# Patient Record
Sex: Female | Born: 1960 | Race: White | Hispanic: No | Marital: Single | Smoking: Never smoker
Health system: Southern US, Community
[De-identification: ages and names within clinical notes are randomized; demographics above are authoritative.]

## PROBLEM LIST (undated history)

## (undated) DIAGNOSIS — Z9889 Other specified postprocedural states: Secondary | ICD-10-CM

## (undated) DIAGNOSIS — I219 Acute myocardial infarction, unspecified: Secondary | ICD-10-CM

## (undated) DIAGNOSIS — I10 Essential (primary) hypertension: Secondary | ICD-10-CM

## (undated) DIAGNOSIS — E785 Hyperlipidemia, unspecified: Secondary | ICD-10-CM

## (undated) DIAGNOSIS — R0681 Apnea, not elsewhere classified: Secondary | ICD-10-CM

## (undated) DIAGNOSIS — K7460 Unspecified cirrhosis of liver: Secondary | ICD-10-CM

## (undated) DIAGNOSIS — R112 Nausea with vomiting, unspecified: Secondary | ICD-10-CM

## (undated) DIAGNOSIS — T4145XA Adverse effect of unspecified anesthetic, initial encounter: Secondary | ICD-10-CM

## (undated) DIAGNOSIS — T8859XA Other complications of anesthesia, initial encounter: Secondary | ICD-10-CM

## (undated) HISTORY — DX: Apnea, not elsewhere classified: R06.81

## (undated) HISTORY — DX: Acute myocardial infarction, unspecified: I21.9

## (undated) HISTORY — DX: Essential (primary) hypertension: I10

## (undated) HISTORY — PX: CARPAL TUNNEL RELEASE: SHX101

## (undated) HISTORY — PX: ROTATOR CUFF REPAIR: SHX139

## (undated) HISTORY — DX: Hyperlipidemia, unspecified: E78.5

## (undated) HISTORY — PX: CHOLECYSTECTOMY: SHX55

---
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
## 2014-11-08 ENCOUNTER — Telehealth: Payer: Self-pay | Admitting: Pediatrics

## 2014-11-08 NOTE — Telephone Encounter (Signed)
Was in the office on 10/25/14 for asthma and allergies. She has taken the Prednisone and Amoxacillin that was prescribed but is still not feeling any better. Can she have something different or is there something else that she needs to try. She uses [facility] which closes 6:30pm.

## 2014-11-09 NOTE — Telephone Encounter (Signed)
Was in the office on 10/25/14 for asthma and allergies.she has taken the prednisone and amoxicillin that was prescribed but is still not feeling any better. Can she have something different or is there something else that she needs to try? She uses [facility] which closes 6:30pm

## 2014-11-09 NOTE — Telephone Encounter (Signed)
Spoke with pt. She will call back next week if not feeling better.

## 2014-11-09 NOTE — Telephone Encounter (Signed)
Inform patient that antibiotics will need some time to work, please continue allergy and asthma medications.  Call us next week if not better.

## 2017-01-15 ENCOUNTER — Other Ambulatory Visit (HOSPITAL_COMMUNITY): Payer: Self-pay | Admitting: General Surgery

## 2017-01-28 ENCOUNTER — Ambulatory Visit (HOSPITAL_COMMUNITY): Payer: BLUE CROSS/BLUE SHIELD

## 2017-01-28 ENCOUNTER — Ambulatory Visit (HOSPITAL_COMMUNITY)
Admission: RE | Admit: 2017-01-28 | Discharge: 2017-01-28 | Disposition: A | Discharge status: Home / Self Care | Payer: BLUE CROSS/BLUE SHIELD | Source: Ambulatory Visit | Attending: General Surgery | Admitting: General Surgery

## 2017-01-28 DIAGNOSIS — I517 Cardiomegaly: Secondary | ICD-10-CM | POA: Insufficient documentation

## 2017-01-28 DIAGNOSIS — Z9049 Acquired absence of other specified parts of digestive tract: Secondary | ICD-10-CM | POA: Insufficient documentation

## 2017-01-28 DIAGNOSIS — K222 Esophageal obstruction: Secondary | ICD-10-CM | POA: Diagnosis not present

## 2017-02-15 ENCOUNTER — Other Ambulatory Visit: Payer: Self-pay | Admitting: General Surgery

## 2017-02-15 DIAGNOSIS — E049 Nontoxic goiter, unspecified: Secondary | ICD-10-CM

## 2017-02-25 ENCOUNTER — Ambulatory Visit
Admission: RE | Admit: 2017-02-25 | Discharge: 2017-02-25 | Disposition: A | Discharge status: Home / Self Care | Payer: BLUE CROSS/BLUE SHIELD | Source: Ambulatory Visit | Attending: General Surgery | Admitting: General Surgery

## 2017-02-25 DIAGNOSIS — E049 Nontoxic goiter, unspecified: Secondary | ICD-10-CM

## 2017-05-17 ENCOUNTER — Encounter: Payer: Self-pay | Admitting: Skilled Nursing Facility1

## 2017-05-17 ENCOUNTER — Encounter: Payer: BLUE CROSS/BLUE SHIELD | Attending: General Surgery | Admitting: Skilled Nursing Facility1

## 2017-05-17 DIAGNOSIS — Z6841 Body Mass Index (BMI) 40.0 and over, adult: Secondary | ICD-10-CM | POA: Insufficient documentation

## 2017-05-17 DIAGNOSIS — Z713 Dietary counseling and surveillance: Secondary | ICD-10-CM | POA: Insufficient documentation

## 2017-05-17 DIAGNOSIS — E669 Obesity, unspecified: Secondary | ICD-10-CM

## 2017-05-17 NOTE — Progress Notes (Signed)
Pre-Op Assessment Visit:  Pre-Operative Sleeve Gastrectomy Surgery  Medical Nutrition Therapy:  Appt start time: 8:01  End time:  9:00  Patient was seen on 05/17/2017  for Pre-Operative Nutrition Assessment. Assessment and letter of approval fax[facility] to [facility] coordinator on 05/17/2017.   Pt reports being very busy at work. Pt states she just had plantar fasciitis surgery so she has trouble walking. Pt states her son is autistic and is a picky eater. Pt states she does not cook because she would only be cooking for herself. Pt states her son will only eat [employer] nuggets. Pt states she is on the road a lot. Pt states she cannot get her son certain help because she does not want to take him out of school.   Pt expectation of surgery: to lose weight and get off some medications   Pt expectation of Dietitian: to help me get different meal plans that are more suitable for being on the road   Start weight at [facility]: 299.7 BMI: 54.82  24 hr Dietary Recall: First Meal: fast food biscuit---oatmeal Snack: Second Meal: 1 slice of pizza Snack: fruit Third Meal: 6 in meatball sub from [employer] Snack:  Beverages: soda, water, 2% milk  Encouraged to engage in 150 minutes of moderate physical activity including cardiovascular and weight baring weekly  Handouts given during visit include:  . Pre-Op Goals . Bariatric Surgery Protein Shakes During the appointment today the following Pre-Op Goals were reviewed with the patient: . Maintain or lose weight as instructed by your surgeon . Make healthy food choices . Begin to limit portion sizes . Limited concentrated sugars and fried foods . Keep fat/sugar in the single digits per serving on             food labels . Practice CHEWING your food  (aim for 30 chews per bite or until applesauce consistency) . Practice not drinking 15 minutes before, during, and 30 minutes after each meal/snack . Avoid all  carbonated beverages  . Avoid/limit caffeinated beverages  . Avoid all sugar-sweetened beverages . Consume 3 meals per day; eat every 3-5 hours . Make a list of non-food related activities . Aim for 64-100 ounces of FLUID daily  . Aim for at least 60-80 grams of PROTEIN daily . Look for a liquid protein source that contain ?15 g protein and ?5 g carbohydrate  (ex: shakes, drinks, shots)  -Follow diet recommendations listed below   Energy and Macronutrient Recomendations: Calories: 1500 Carbohydrate: 170 Protein: 112 Fat: 42  Demonstrated degree of understanding via:  Teach Back  Teaching Method Utilized:  Visual Auditory Hands on  Barriers to learning/adherence to lifestyle change: work/life balance and increased stress   Patient to call the [facility] to enroll in Pre-Op and Post-Op Nutrition Education when surgery date is scheduled.

## 2017-06-22 ENCOUNTER — Ambulatory Visit (INDEPENDENT_AMBULATORY_CARE_PROVIDER_SITE_OTHER): Payer: BLUE CROSS/BLUE SHIELD | Admitting: Psychiatry

## 2017-06-22 DIAGNOSIS — F509 Eating disorder, unspecified: Secondary | ICD-10-CM | POA: Diagnosis not present

## 2017-07-01 ENCOUNTER — Ambulatory Visit (INDEPENDENT_AMBULATORY_CARE_PROVIDER_SITE_OTHER): Payer: BLUE CROSS/BLUE SHIELD | Admitting: Psychiatry

## 2017-07-01 DIAGNOSIS — F509 Eating disorder, unspecified: Secondary | ICD-10-CM

## 2017-07-12 ENCOUNTER — Ambulatory Visit: Payer: Self-pay

## 2017-07-19 ENCOUNTER — Encounter: Payer: BLUE CROSS/BLUE SHIELD | Attending: General Surgery | Admitting: Skilled Nursing Facility1

## 2017-07-19 DIAGNOSIS — Z6841 Body Mass Index (BMI) 40.0 and over, adult: Secondary | ICD-10-CM | POA: Insufficient documentation

## 2017-07-19 DIAGNOSIS — Z713 Dietary counseling and surveillance: Secondary | ICD-10-CM | POA: Diagnosis present

## 2017-07-19 DIAGNOSIS — E669 Obesity, unspecified: Secondary | ICD-10-CM

## 2017-07-21 ENCOUNTER — Encounter: Payer: Self-pay | Admitting: Skilled Nursing Facility1

## 2017-07-21 NOTE — Progress Notes (Signed)
Pre-Operative Nutrition Class:  Appt start time: 1655   End time:  1830.  Patient was seen on 07/19/2017 for Pre-Operative Bariatric Surgery Education at the [facility].   Surgery date:  Surgery type: sleeve Start weight at [facility]: 299.7 Weight today: 305  Samples given per MNT protocol. Patient educated on appropriate usage: Bariatric Advantage Multivitamin Lot # [id number] Exp: 10/20  Bariatric Advantage Calcium  Lot # [id number] Exp: oct-08-2017  Renee Pain Protein  Shake Lot # [id number] Exp: oct-13-19   The following the learning objectives were met by the patient during this course:  Identify Pre-Op Dietary Goals and will begin 2 weeks pre-operatively  Identify appropriate sources of fluids and proteins   State protein recommendations and appropriate sources pre and post-operatively  Identify Post-Operative Dietary Goals and will follow for 2 weeks post-operatively  Identify appropriate multivitamin and calcium sources  Describe the need for physical activity post-operatively and will follow MD recommendations  State when to call healthcare provider regarding medication questions or post-operative complications  Handouts given during class include:  Pre-Op Bariatric Surgery Diet Handout  Protein Shake Handout  Post-Op Bariatric Surgery Nutrition Handout  [facility] Program Information Flyer  Support Group Information Flyer  [facility] Bariatric Supplements Price List  Follow-Up Plan: [facility] advancement per MD.

## 2017-07-27 NOTE — Progress Notes (Signed)
Please place orders in epic pt. Has a preop appt 07-29-17 Thank you!

## 2017-07-29 ENCOUNTER — Inpatient Hospital Stay (HOSPITAL_COMMUNITY)
Admission: RE | Admit: 2017-07-29 | Discharge: 2017-07-29 | Disposition: A | Discharge status: Home / Self Care | Payer: Self-pay | Source: Ambulatory Visit

## 2017-08-05 ENCOUNTER — Inpatient Hospital Stay (HOSPITAL_COMMUNITY)
Admission: RE | Admit: 2017-08-05 | Payer: BLUE CROSS/BLUE SHIELD | Source: Ambulatory Visit | Admitting: General Surgery

## 2017-08-05 ENCOUNTER — Encounter (HOSPITAL_COMMUNITY): Admission: RE | Payer: Self-pay | Source: Ambulatory Visit

## 2017-08-05 SURGERY — GASTRECTOMY, SLEEVE, LAPAROSCOPIC
Anesthesia: General

## 2017-08-17 ENCOUNTER — Ambulatory Visit: Payer: Self-pay

## 2018-05-24 ENCOUNTER — Ambulatory Visit: Payer: BLUE CROSS/BLUE SHIELD | Admitting: Psychiatry

## 2018-06-02 ENCOUNTER — Ambulatory Visit: Payer: BLUE CROSS/BLUE SHIELD | Admitting: Psychiatry

## 2018-07-19 ENCOUNTER — Ambulatory Visit: Payer: BLUE CROSS/BLUE SHIELD | Admitting: Psychiatry

## 2018-09-02 ENCOUNTER — Ambulatory Visit: Payer: Self-pay | Admitting: General Surgery

## 2018-09-02 NOTE — H&P (Signed)
Lauren Simmons Documented: 09/02/2018 11:08 AM Location: [facility] Patient #: [id number] DOB: 1960/05/20 Divorced / Language: [name] / Race: White Female  History of Present Illness Lauren Hiss M. [name] MD; 09/02/2018 12:17 PM) The patient is a 58 year old female who presents for a bariatric surgery evaluation. She comes in today for long-term follow-up regarding her obesity and comorbidities. I last saw her in February of this year. The only medical changes or events since her last visit was that she developed a kidney stone that required intervention. Otherwise nothing new medical. She has seen her PCP in July. She also had follow-up with gastroenterology in June. She has Nash/cryptogenic cirrhosis. She has a nodular liver on CT. But on upper endoscopy there is no evidence of varices, PHT or GAVE. There is also no radiological evidence of varices on her outside CT reports or splenomegaly. She had extensive blood work by her primary care team in June. The panel was normal except for a triglyceride level of 156. T bili was 1.5 which is stable. Alkaline phosphatase chronically elevated at around 125. AST and ALT chronically elevated at 104 and 77. Vitamin D 38. B12 was 238. Normal CBC with a hemoglobin of 14.6, platelet count 152. INR 1.27. PT 13.3. Creatinine 0.94. Sodium 137. Negative hepatitis panel. Mammogram normal. She had cardiac follow-up on July 10 it was felt to be low risk for bariatric surgery.  She denies any chest pain, chest pressure, shortness of breath. She denies any angina. She is still on a blood thinner for DVT history. She takes medicine for reflux   03/17/2018 She comes in for long-term follow-up regarding her obesity and discussions regarding weight loss surgery. Since her last visit she had pulmonary emboli which we had to delay her surgery. She was referred to hematology and underwent a hematological workup. There is no genetic tendency even  though she does have a family history of blood clots. She is on lifelong Xarelto. As a result she has some chronic dyspnea on exertion. She last saw her cardiologist in October 2019 at [place]. She ended up having kidney stones surgery last month but had to undergo a cardiac stress test at [facility] prior to that surgery. She states her stress test was normal and she was cleared for her kidney stone surgery. Otherwise she denies any significant medical changes since I last saw her. She had a CT scan in September 2019 which showed a fat-containing umbilical hernia. There is also some radiological evidence of cirrhosis in the liver but there is no signs of varices or portal hypertension. She is not a drinker. She had some mildly elevated AST and ALT around 50 and 53. All of this was consistent with Lauren Simmons. She has some chronic thrombocytopenia with a platelet count to 130s. She denies any chest pain or chest pressure. She does get short of breath with activity. She denies any peripheral edema. She denies any angina. She denies any TIAs or amaurosis fugax. She no longer has the sensation of food getting stuck when drinking or eating.  I reviewed her encounters with her cardiologist, hematologist and primary care physician in care everywhere along with the results of her CT scan and lab work since her last visit  07/13/2017 She comes in for long-term follow-up to rediscussed weight loss surgery. I initially met her in December 2018. She has had surgery for plantars fasciitis since I last saw her otherwise she denies any medical changes. She still has reflux. After  her initial visit she started her bariatric evaluation. Her upper GI showed smooth tapering in the thoracic inlet for approximately 3.7 cm however a barium tablet passed without difficulty. When I contacted her she did endorse a sensation of occasional food getting stuck sensation. A thyroid ultrasound was done to see if the  tapering was due to extrinsic compression-her thyroid ultrasound was normal. She had her plantar fasciitis surgery in January she states interestingly after that surgery she no longer has the sensation of occasional foods getting stuck. It is unclear whether or not an orogastric tube or perhaps endotracheal to open things up. She had an upper endoscopy in late February which revealed a normal esophagus without stricture or masses. Biopsy was negative. She had 2 benign colonic polyps were removed. She denies any chest pain, chest pressure, shortness of breath, orthopnea, paroxysmal nocturnal dyspnea, TIAs or amaurosis fugax.  Her bariatric evaluation labs were unremarkable. She has completed her psychological and nutrition assessments as well   01/2017 She is referred by Dr Lauren Simmons for evaluation of weight loss surgery. She primarily sees Lauren Beau NP in that office down in [place]. Her cardiologist is Dr Lauren Simmons. She completed our seminar in person. Despite numerous attempts for sustained weight loss she has been unsuccessful. She has tried weight watchers, over-the-counter diet pills, as well as the Atkins diet. She is interested in the sleeve gastrectomy. She has several acquaintances who have had the sleeve.  Her comorbidities include hypertension, obstructive sleep apnea on CPAP, fatty liver disease, gastroesophageal reflux disease, bilateral plantar fasciitis, prediabetes  She denies any chest pain, chest pressure, shortness of breath, orthopnea, paroxysmal nocturnal dyspnea, dyspnea on exertion, angina. She denies any personal or family history of blood clots. She reportedly had a normal cardiac cath in 2014. She uses CPAP nightly. She takes Prilosec twice a day. It controls her reflux symptoms. She has had a cholecystectomy. She has daily bowel movements. She denies any melena or hematochezia. She has had a normal colonoscopy and is on a 10-year recall list. She denies any  dysuria or hematuria. She has a history of a left patella fracture as well as bilateral plantar fasciitis. She has had right rotator cuff surgery. She denies any TIAs or amaurosis fugax. She has migraines a couple times a year. She denies any alcohol or drug use or tobacco use. She works for the [employer]. She had recent blood work done which showed a AST of 44, ALT of 62. Total cholesterol level 149, LDL 83, triglyceride level 113, HDL level 49. a1c 5.6   Problem List/Past Medical Lauren Hiss M. Redmond Pulling, MD; 09/02/2018 12:29 PM) PLANTAR FASCIITIS, BILATERAL (M72.2) HYPERTENSION, ESSENTIAL (I10) FAMILY HISTORY OF PULMONARY EMBOLISM (Q68.34) UMBILICAL HERNIA, INCARCERATED (K42.0) GASTROESOPHAGEAL REFLUX DISEASE, ESOPHAGITIS PRESENCE NOT SPECIFIED (K21.9) HISTORY OF PULMONARY EMBOLISM (Z86.711) FATTY LIVER DISEASE, NONALCOHOLIC (H96.2) PREDIABETES (R73.03) MORBID OBESITY WITH BMI OF 50.0-59.9, ADULT (E66.01) The patient meets weight loss surgery criteria.  Past Surgical History Lauren Hiss M. 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  Diagnostic Studies History Lauren Hiss M. Redmond Pulling, MD; 09/02/2018 12:29 PM) Colonoscopy 1-5 years ago Mammogram within last year  Allergies Nance Pew, [place]; 09/02/2018 11:08 AM) Sulfa Antibiotics Allergies Reconciled  Medication History (Sabrina Canty, CMA; 09/02/2018 11:09 AM) Gabapentin (300MG Capsule, Oral) Active. Xarelto (20MG Tablet, Oral) Active. hydroCHLOROthiazide (25MG Tablet, Oral) Active. Zocor (5MG Tablet, Oral) Active. Flexeril (5MG Tablet, Oral) Active.  Omeprazole (40MG Capsule DR, Oral) Active. TraZODone HCl (50MG Tablet, Oral) Active. Losartan Potassium (50MG Tablet, Oral) Active. Potassium Chloride ER (10MEQ Tablet ER, Oral) Active. Medications Reconciled  Social  History Lauren Hiss M. Redmond Pulling, MD; 09/02/2018 12:29 PM) Caffeine use Carbonated beverages. No alcohol use No drug use Tobacco use Never smoker.  Family History Lauren Hiss M. Redmond Pulling, MD; 09/02/2018 12:29 PM) Cerebrovascular Accident Father. Heart Disease Brother, Mother. Heart disease in female family member before age 81 Hypertension Mother, Sister. Migraine Headache Sister.  Pregnancy / Birth History Lauren Hiss M. Redmond Pulling, MD; 09/02/2018 12:29 PM) Age at menarche 65 years. Age of menopause 42-55 Length (months) of breastfeeding 3-6 Maternal age 60-35 Para 1  Other Problems Lauren Ruff. Redmond Pulling, MD; 09/02/2018 12:29 PM) Diverticulosis Gastroesophageal Reflux Disease Heart murmur Hemorrhoids High blood pressure Hypercholesterolemia Inguinal Hernia Migraine Headache Myocardial infarction Sleep Apnea     Review of Systems Lauren Hiss M. Vita Currin MD; 09/02/2018 12:17 PM) All other systems negative  Vitals (Sabrina Canty CMA; 09/02/2018 11:11 AM) 09/02/2018 11:09 AM Weight: 316.25 lb Height: 63.5in Body Surface Area: 2.36 m Body Mass Index: 55.14 kg/m  Temp.: 98.32F(Temporal)  Pulse: 90 (Regular)  Resp.: 17 (Unlabored)  BP: 132/84 (Sitting, Left Arm, Standard)        Physical Exam Lauren Hiss M. Dulcey Riederer MD; 09/02/2018 12:18 PM)  General Mental Status-Alert. General Appearance-Consistent with stated age. Hydration-Well hydrated. Voice-Normal.  Head and Neck Head-normocephalic, atraumatic with no lesions or palpable masses. Trachea-midline. Thyroid Gland Characteristics - normal size and consistency.  Eye Eyeball - Bilateral-Extraocular movements intact. Sclera/Conjunctiva - Bilateral-No scleral icterus.  ENMT Ears -Note:normal external ears.  Nose and Sinuses External Inspection of the Nose - symmetric, no crepitus palpated. Mouth and Throat -Note:lips intact.   Chest and Lung Exam Chest and lung exam reveals -quiet, even and  easy respiratory effort with no use of accessory muscles and on auscultation, normal breath sounds, no adventitious sounds and normal vocal resonance. Inspection Chest Wall - Normal. Back - normal.  Breast - Did not examine.  Cardiovascular Cardiovascular examination reveals -normal heart sounds, regular rate and rhythm with no murmurs and normal pedal pulses bilaterally.  Abdomen Inspection  Inspection of the abdomen reveals: Note: small nontender umbilical hernia,. Skin - Scar - Note: old trocar scars. Palpation/Percussion Palpation and Percussion of the abdomen reveal - Soft, Non Tender, No Rebound tenderness, No Rigidity (guarding) and No hepatosplenomegaly. Auscultation Auscultation of the abdomen reveals - Bowel sounds normal.  Peripheral Vascular Upper Extremity Palpation - Pulses bilaterally normal.  Neurologic Neurologic evaluation reveals -alert and oriented x 3 with no impairment of recent or remote memory. Mental Status-Normal.  Neuropsychiatric The patient's mood and affect are described as -normal. Judgment and Insight-insight is appropriate concerning matters relevant to self.  Musculoskeletal Normal Exam - Left-Upper Extremity Strength Normal and Lower Extremity Strength Normal. Normal Exam - Right-Upper Extremity Strength Normal and Lower Extremity Strength Normal.  Lymphatic Head & Neck  General Head & Neck Lymphatics: Bilateral - Description - Normal. Axillary - Did not examine. Femoral & Inguinal - Did not examine.    Assessment & Plan Lauren Hiss M. Easton Fetty MD; 09/02/2018 12:22 PM)  MORBID OBESITY WITH BMI OF 50.0-59.9, ADULT (E66.01) Story: The patient meets weight loss surgery criteria. Impression: The patient meets weight loss surgery criteria. I think the patient would be an acceptable candidate for Laparoscopic vertical sleeve gastrectomy.  We briefly rediscussed LSG. She declined to go over risk/benefits again. We specifically however  discussed risk of long-term issues with reflux or GERD after  sleeve gastrectomy. However given her current comorbidities I think the sleeve gastrectomy is probably a "safer" procedure for her then a Roux-en-Y gastric bypass. We discussed the potential increased risk of bleeding for her because of her cryptogenic cirrhosis as well as the need to anticoagulate her because of her history of DVT. We also discussed the risk of operating during coronavirus pandemic. We discussed that she would be tested for coronavirus preoperatively and she would need to isolate and social distance between her screening tests and surgery. We discussed that should she contract coronavirus perioperatively her risk for complications and potentially death her significantly increased  I think she is medically ok to proceed to bariatric surgery although she is slightly at increased risk for complications and mortality than the average bariatric surgery patient. She voiced understanding  We discussed the importance of the preoperative meal plan.  Current Plans Pt Education - EMW_preopbariatric  FATTY LIVER DISEASE, NONALCOHOLIC (Z20.8) Impression: no evidence of varices, portal hypertension on imaging. discussed her 'cirrhosis' dx on ct. her current meld score is 25 which gives her a 6 month mortality average of around 6%. Her [facility] postoperative mortality risk score is 3.8% at 30 days. I still think these are acceptable risk   HISTORY OF PULMONARY EMBOLISM (Z86.711) Impression: We did discuss that her history of pulmonary embolism does increase her risk for perioperative blood clots. Her hematological workup was negative for an underlying disorder. I advised her that she would go out on extended chemical DVT prophylaxis with Lovenox before converting her back to her oral agent.   UMBILICAL HERNIA, INCARCERATED (K42.0) Impression: We discussed her incarcerated umbilical hernia with omentum. I advised her that the goal  would be to leave this alone during surgery. We discussed that if we did intervene on it during her bariatric procedure that the risk of recurrence to be quite high because of her current weight. I believe we should be able to work around it. We did discuss that if for some reason if I had to reduce the herniated contents and the fascial defect was relatively small that we would repair it without mesh that she would be at a significant risk of recurrence.   PLANTAR FASCIITIS, BILATERAL (M72.2)   HYPERTENSION, ESSENTIAL (I10)   GASTROESOPHAGEAL REFLUX DISEASE, ESOPHAGITIS PRESENCE NOT SPECIFIED (K21.9) Impression: We did rediscuss that gastroesophageal reflux disease could worsen with sleeve gastrectomy.   PREDIABETES (R73.03)  Lauren Ruff. Redmond Pulling, MD, FACS General, Bariatric, & Minimally Invasive Surgery [facility], [place]

## 2018-09-02 NOTE — H&P (View-Only) (Signed)
Lauren Simmons Documented: 09/02/2018 11:08 AM Location: [facility] Patient #: [id number] DOB: February 25, 1960 Divorced / Language: [name] / Race: White Female  History of Present Illness [name] M. Lauren Kerper MD; 09/02/2018 12:17 PM) The patient is a 58 year old female who presents for a bariatric surgery evaluation. She comes in today for long-term follow-up regarding her obesity and comorbidities. I last saw her in February of this year. The only medical changes or events since her last visit was that she developed a kidney stone that required intervention. Otherwise nothing new medical. She has seen her PCP in July. She also had follow-up with gastroenterology in June. She has Nash/cryptogenic cirrhosis. She has a nodular liver on CT. But on upper endoscopy there is no evidence of varices, PHT or GAVE. There is also no radiological evidence of varices on her outside CT reports or splenomegaly. She had extensive blood work by her primary care team in June. The panel was normal except for a triglyceride level of 156. T bili was 1.5 which is stable. Alkaline phosphatase chronically elevated at around 125. AST and ALT chronically elevated at 104 and 77. Vitamin D 38. B12 was 238. Normal CBC with a hemoglobin of 14.6, platelet count 152. INR 1.27. PT 13.3. Creatinine 0.94. Sodium 137. Negative hepatitis panel. Mammogram normal. She had cardiac follow-up on July 10 it was felt to be low risk for bariatric surgery.  She denies any chest pain, chest pressure, shortness of breath. She denies any angina. She is still on a blood thinner for DVT history. She takes medicine for reflux   03/17/2018 She comes in for long-term follow-up regarding her obesity and discussions regarding weight loss surgery. Since her last visit she had pulmonary emboli which we had to delay her surgery. She was referred to hematology and underwent a hematological workup. There is no genetic tendency even  though she does have a family history of blood clots. She is on lifelong Xarelto. As a result she has some chronic dyspnea on exertion. She last saw her cardiologist in October 2019 at [place]. She ended up having kidney stones surgery last month but had to undergo a cardiac stress test at [facility] prior to that surgery. She states her stress test was normal and she was cleared for her kidney stone surgery. Otherwise she denies any significant medical changes since I last saw her. She had a CT scan in September 2019 which showed a fat-containing umbilical hernia. There is also some radiological evidence of cirrhosis in the liver but there is no signs of varices or portal hypertension. She is not a drinker. She had some mildly elevated AST and ALT around 50 and 53. All of this was consistent with Karlene Simmons. She has some chronic thrombocytopenia with a platelet count to 130s. She denies any chest pain or chest pressure. She does get short of breath with activity. She denies any peripheral edema. She denies any angina. She denies any TIAs or amaurosis fugax. She no longer has the sensation of food getting stuck when drinking or eating.  I reviewed her encounters with her cardiologist, hematologist and primary care physician in care everywhere along with the results of her CT scan and lab work since her last visit  07/13/2017 She comes in for long-term follow-up to rediscussed weight loss surgery. I initially met her in December 2018. She has had surgery for plantars fasciitis since I last saw her otherwise she denies any medical changes. She still has reflux. After  her initial visit she started her bariatric evaluation. Her upper GI showed smooth tapering in the thoracic inlet for approximately 3.7 cm however a barium tablet passed without difficulty. When I contacted her she did endorse a sensation of occasional food getting stuck sensation. A thyroid ultrasound was done to see if the  tapering was due to extrinsic compression-her thyroid ultrasound was normal. She had her plantar fasciitis surgery in January she states interestingly after that surgery she no longer has the sensation of occasional foods getting stuck. It is unclear whether or not an orogastric tube or perhaps endotracheal to open things up. She had an upper endoscopy in late February which revealed a normal esophagus without stricture or masses. Biopsy was negative. She had 2 benign colonic polyps were removed. She denies any chest pain, chest pressure, shortness of breath, orthopnea, paroxysmal nocturnal dyspnea, TIAs or amaurosis fugax.  Her bariatric evaluation labs were unremarkable. She has completed her psychological and nutrition assessments as well   01/2017 She is referred by [name] for evaluation of weight loss surgery. She primarily sees [name] in that office down in [place]. Her cardiologist is [name]. She completed our seminar in person. Despite numerous attempts for sustained weight loss she has been unsuccessful. She has tried weight watchers, over-the-counter diet pills, as well as the Atkins diet. She is interested in the sleeve gastrectomy. She has several acquaintances who have had the sleeve.  Her comorbidities include hypertension, obstructive sleep apnea on CPAP, fatty liver disease, gastroesophageal reflux disease, bilateral plantar fasciitis, prediabetes  She denies any chest pain, chest pressure, shortness of breath, orthopnea, paroxysmal nocturnal dyspnea, dyspnea on exertion, angina. She denies any personal or family history of blood clots. She reportedly had a normal cardiac cath in 2014. She uses CPAP nightly. She takes Prilosec twice a day. It controls her reflux symptoms. She has had a cholecystectomy. She has daily bowel movements. She denies any melena or hematochezia. She has had a normal colonoscopy and is on a 10-year recall list. She denies any  dysuria or hematuria. She has a history of a left patella fracture as well as bilateral plantar fasciitis. She has had right rotator cuff surgery. She denies any TIAs or amaurosis fugax. She has migraines a couple times a year. She denies any alcohol or drug use or tobacco use. She works for the [employer]. She had recent blood work done which showed a AST of 44, ALT of 62. Total cholesterol level 149, LDL 83, triglyceride level 113, HDL level 49. a1c 5.6   Problem List/Past Medical [name] M. Redmond Pulling, MD; 09/02/2018 12:29 PM) PLANTAR FASCIITIS, BILATERAL (M72.2) HYPERTENSION, ESSENTIAL (I10) FAMILY HISTORY OF PULMONARY EMBOLISM (G92.11) UMBILICAL HERNIA, INCARCERATED (K42.0) GASTROESOPHAGEAL REFLUX DISEASE, ESOPHAGITIS PRESENCE NOT SPECIFIED (K21.9) HISTORY OF PULMONARY EMBOLISM (Z86.711) FATTY LIVER DISEASE, NONALCOHOLIC (H41.7) PREDIABETES (R73.03) MORBID OBESITY WITH BMI OF 50.0-59.9, ADULT (E66.01) The patient meets weight loss surgery criteria.  Past Surgical History [name] M. 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  Diagnostic Studies History [name] M. Redmond Pulling, MD; 09/02/2018 12:29 PM) Colonoscopy 1-5 years ago Mammogram within last year  Allergies Nance Pew, [place]; 09/02/2018 11:08 AM) Sulfa Antibiotics Allergies Reconciled  Medication History (Sabrina Canty, CMA; 09/02/2018 11:09 AM) Gabapentin (300MG Capsule, Oral) Active. Xarelto (20MG Tablet, Oral) Active. hydroCHLOROthiazide (25MG Tablet, Oral) Active. Zocor (5MG Tablet, Oral) Active. Flexeril (5MG Tablet, Oral) Active.  Omeprazole (40MG Capsule DR, Oral) Active. TraZODone HCl (50MG Tablet, Oral) Active. Losartan Potassium (50MG Tablet, Oral) Active. Potassium Chloride ER (10MEQ Tablet ER, Oral) Active. Medications Reconciled  Social  History [name] M. Redmond Pulling, MD; 09/02/2018 12:29 PM) Caffeine use Carbonated beverages. No alcohol use No drug use Tobacco use Never smoker.  Family History [name] M. Redmond Pulling, MD; 09/02/2018 12:29 PM) Cerebrovascular Accident Father. Heart Disease Brother, Mother. Heart disease in female family member before age 13 Hypertension Mother, Sister. Migraine Headache Sister.  Pregnancy / Birth History [name] M. Redmond Pulling, MD; 09/02/2018 12:29 PM) Age at menarche 74 years. Age of menopause 31-55 Length (months) of breastfeeding 3-6 Maternal age 73-35 Para 1  Other Problems Leighton Ruff. Redmond Pulling, MD; 09/02/2018 12:29 PM) Diverticulosis Gastroesophageal Reflux Disease Heart murmur Hemorrhoids High blood pressure Hypercholesterolemia Inguinal Hernia Migraine Headache Myocardial infarction Sleep Apnea     Review of Systems [name] M. Soley Harriss MD; 09/02/2018 12:17 PM) All other systems negative  Vitals (Sabrina Canty CMA; 09/02/2018 11:11 AM) 09/02/2018 11:09 AM Weight: 316.25 lb Height: 63.5in Body Surface Area: 2.36 m Body Mass Index: 55.14 kg/m  Temp.: 98.12F(Temporal)  Pulse: 90 (Regular)  Resp.: 17 (Unlabored)  BP: 132/84 (Sitting, Left Arm, Standard)        Physical Exam [name] M. Francys Bolin MD; 09/02/2018 12:18 PM)  General Mental Status-Alert. General Appearance-Consistent with stated age. Hydration-Well hydrated. Voice-Normal.  Head and Neck Head-normocephalic, atraumatic with no lesions or palpable masses. Trachea-midline. Thyroid Gland Characteristics - normal size and consistency.  Eye Eyeball - Bilateral-Extraocular movements intact. Sclera/Conjunctiva - Bilateral-No scleral icterus.  ENMT Ears -Note:normal external ears.  Nose and Sinuses External Inspection of the Nose - symmetric, no crepitus palpated. Mouth and Throat -Note:lips intact.   Chest and Lung Exam Chest and lung exam reveals -quiet, even and  easy respiratory effort with no use of accessory muscles and on auscultation, normal breath sounds, no adventitious sounds and normal vocal resonance. Inspection Chest Wall - Normal. Back - normal.  Breast - Did not examine.  Cardiovascular Cardiovascular examination reveals -normal heart sounds, regular rate and rhythm with no murmurs and normal pedal pulses bilaterally.  Abdomen Inspection  Inspection of the abdomen reveals: Note: small nontender umbilical hernia,. Skin - Scar - Note: old trocar scars. Palpation/Percussion Palpation and Percussion of the abdomen reveal - Soft, Non Tender, No Rebound tenderness, No Rigidity (guarding) and No hepatosplenomegaly. Auscultation Auscultation of the abdomen reveals - Bowel sounds normal.  Peripheral Vascular Upper Extremity Palpation - Pulses bilaterally normal.  Neurologic Neurologic evaluation reveals -alert and oriented x 3 with no impairment of recent or remote memory. Mental Status-Normal.  Neuropsychiatric The patient's mood and affect are described as -normal. Judgment and Insight-insight is appropriate concerning matters relevant to self.  Musculoskeletal Normal Exam - Left-Upper Extremity Strength Normal and Lower Extremity Strength Normal. Normal Exam - Right-Upper Extremity Strength Normal and Lower Extremity Strength Normal.  Lymphatic Head & Neck  General Head & Neck Lymphatics: Bilateral - Description - Normal. Axillary - Did not examine. Femoral & Inguinal - Did not examine.    Assessment & Plan [name] M. Breylin Dom MD; 09/02/2018 12:22 PM)  MORBID OBESITY WITH BMI OF 50.0-59.9, ADULT (E66.01) Story: The patient meets weight loss surgery criteria. Impression: The patient meets weight loss surgery criteria. I think the patient would be an acceptable candidate for Laparoscopic vertical sleeve gastrectomy.  We briefly rediscussed LSG. She declined to go over risk/benefits again. We specifically however  discussed risk of long-term issues with reflux or GERD after  sleeve gastrectomy. However given her current comorbidities I think the sleeve gastrectomy is probably a "safer" procedure for her then a Roux-en-Y gastric bypass. We discussed the potential increased risk of bleeding for her because of her cryptogenic cirrhosis as well as the need to anticoagulate her because of her history of DVT. We also discussed the risk of operating during coronavirus pandemic. We discussed that she would be tested for coronavirus preoperatively and she would need to isolate and social distance between her screening tests and surgery. We discussed that should she contract coronavirus perioperatively her risk for complications and potentially death her significantly increased  I think she is medically ok to proceed to bariatric surgery although she is slightly at increased risk for complications and mortality than the average bariatric surgery patient. She voiced understanding  We discussed the importance of the preoperative meal plan.  Current Plans Pt Education - EMW_preopbariatric  FATTY LIVER DISEASE, NONALCOHOLIC (I16.4) Impression: no evidence of varices, portal hypertension on imaging. discussed her 'cirrhosis' dx on ct. her current meld score is 62 which gives her a 6 month mortality average of around 6%. Her [facility] postoperative mortality risk score is 3.8% at 30 days. I still think these are acceptable risk   HISTORY OF PULMONARY EMBOLISM (Z86.711) Impression: We did discuss that her history of pulmonary embolism does increase her risk for perioperative blood clots. Her hematological workup was negative for an underlying disorder. I advised her that she would go out on extended chemical DVT prophylaxis with Lovenox before converting her back to her oral agent.   UMBILICAL HERNIA, INCARCERATED (K42.0) Impression: We discussed her incarcerated umbilical hernia with omentum. I advised her that the goal  would be to leave this alone during surgery. We discussed that if we did intervene on it during her bariatric procedure that the risk of recurrence to be quite high because of her current weight. I believe we should be able to work around it. We did discuss that if for some reason if I had to reduce the herniated contents and the fascial defect was relatively small that we would repair it without mesh that she would be at a significant risk of recurrence.   PLANTAR FASCIITIS, BILATERAL (M72.2)   HYPERTENSION, ESSENTIAL (I10)   GASTROESOPHAGEAL REFLUX DISEASE, ESOPHAGITIS PRESENCE NOT SPECIFIED (K21.9) Impression: We did rediscuss that gastroesophageal reflux disease could worsen with sleeve gastrectomy.   PREDIABETES (R73.03)  Leighton Ruff. Redmond Pulling, MD, FACS General, Bariatric, & Minimally Invasive Surgery [facility], [place]

## 2018-09-03 ENCOUNTER — Telehealth: Payer: Self-pay | Admitting: Skilled Nursing Facility1

## 2018-09-03 NOTE — Telephone Encounter (Signed)
Hello!  Pre-Op diet:  80 ounces of water  NO: bread, dessert, chips, pudding, tortilla, soda, sweet tea, fruit: fresh or canned or applesauce, beans, peas, corn, potatoes, cake, cookies, bottled drinks, cereal, oatmeal, grits, juice, no noodles, pasta, crackers, peanut butter crackers   Peanut butter: limit to 1-2 tablespoons a day   EAT:  Eggs, beef, pork, any seafood, vegetarian: soy crumbles/veggie grillers in the frozen food aisle, chicken, protein drinks Any non-starchy vegetables: collards, lettuce, carrots, tomato, cucumber, onion, squash, zucchini, broccoli, cauliflower( fresh) Use protein shakes as a snack or for cravings at night  Eat 3 meals a day eating every 3-5 hours   DO NOT SKIP MEALS   Thank you, Sandie Ano RD   Dietitian will call her Monday to discuss further and send her the pre-op materials due to not having access to materials currently

## 2018-09-05 ENCOUNTER — Telehealth: Payer: Self-pay | Admitting: Skilled Nursing Facility1

## 2018-09-05 NOTE — Telephone Encounter (Signed)
Called pt for pre-op follow up  Reviewed post op information as well  Taught pt reading the label.   Taught pt about appropriate multvitamins and calcium   Utilized teach back to ensure pt understood recommendations   Scanned in pre-op materials to her email.

## 2018-09-07 NOTE — Progress Notes (Signed)
08-19-18  (Epic) Cardiac clearance from Dr. Vance Peper  02-21-18 ( Epic) Stress test chart everyone

## 2018-09-07 NOTE — Patient Instructions (Signed)
YOU NEED TO HAVE A COVID 19 TEST ON_______ @_______ , THIS TEST MUST BE DONE BEFORE SURGERY, COME  [address], [place] , [id number]. ONCE YOUR COVID TEST IS COMPLETED, PLEASE BEGIN THE QUARANTINE INSTRUCTIONS AS OUTLINED IN YOUR HANDOUT.                Lauren Simmons  09/07/2018   Your procedure is scheduled on: 09-13-18    Report to [facility] Main  Entrance     Report to Admitting at 5:30 AM   1 VISITOR IS ALLOWED TO WAIT IN WAITING ROOM  ONLY DAY OF YOUR SURGERY.    Call this number if you have problems the morning of surgery [phone]    Remember: After Midnight. MORNING OF SURGERY DRINK:   DRINK 1 G2 drink BEFORE YOU LEAVE HOME, DRINK ALL OF THE  G2 DRINK AT ONE TIME.  NO SOLID FOOD AFTER 600 PM THE NIGHT BEFORE YOUR SURGERY. YOU MAY DRINK CLEAR FLUIDS. THE G2 DRINK YOU DRINK BEFORE YOU LEAVE HOME WILL BE THE LAST FLUIDS YOU DRINK BEFORE SURGERY.    CLEAR LIQUID DIET   Foods Allowed                                                                     Foods Excluded  Coffee and tea, regular and decaf                             liquids that you cannot  Plain Jell-O any favor except red or purple                                           see through such as: Fruit ices (not with fruit pulp)                                     milk, soups, orange juice  Iced Popsicles                                    All solid food Carbonated beverages, regular and diet                                    Cranberry, grape and apple juices Sports drinks like Gatorade Lightly seasoned clear broth or consume(fat free) Sugar, honey syrup  Sample Menu Breakfast                                Lunch                                     Supper Cranberry juice  Beef broth                            Chicken broth Jell-O                                     Grape juice                           Apple juice Coffee or tea                        Jell-O                                       Popsicle                                                Coffee or tea                        Coffee or tea  _____________________________________________________________________   PAIN IS EXPECTED AFTER SURGERY AND WILL NOT BE COMPLETELY ELIMINATED. AMBULATION AND TYLENOL WILL HELP REDUCE INCISIONAL AND GAS PAIN. MOVEMENT IS KEY!  YOU ARE EXPECTED TO BE OUT OF BED WITHIN 4 HOURS OF ADMISSION TO YOUR PATIENT ROOM.  SITTING IN THE RECLINER THROUGHOUT THE DAY IS IMPORTANT FOR DRINKING FLUIDS AND MOVING GAS THROUGHOUT THE GI TRACT.  COMPRESSION STOCKINGS SHOULD BE WORN [place] UNLESS YOU ARE WALKING.   INCENTIVE SPIROMETER SHOULD BE USED EVERY HOUR WHILE AWAKE TO DECREASE POST-OPERATIVE COMPLICATIONS SUCH AS PNEUMONIA.  WHEN DISCHARGED HOME, IT IS IMPORTANT TO CONTINUE TO WALK EVERY HOUR AND USE THE INCENTIVE SPIROMETER EVERY HOUR.            Take these medicines the morning of surgery with A SIP OF WATER: Omeprazole (Prilosec)                                You may not have any metal on your body including hair pins and              piercings     Do not wear jewelry, make-up, lotions, powders or perfumes, deodorant              Do not wear nail polish.  Do not shave  48 hours prior to surgery.                Do not bring valuables to the hospital. [place].  Contacts, dentures or bridgework may not be worn into surgery.  Leave suitcase in the car. After surgery it may be brought to your room.     Please read over the following fact sheets you were given: _____________________________________________________________________             [facility] - Preparing for Surgery Before surgery, you can play an important role.  Because skin is not sterile, your  skin needs to be as free of germs as possible.  You can reduce the number of germs on your skin by washing with CHG (chlorahexidine  gluconate) soap before surgery.  CHG is an antiseptic cleaner which kills germs and bonds with the skin to continue killing germs even after washing. Please DO NOT use if you have an allergy to CHG or antibacterial soaps.  If your skin becomes reddened/irritated stop using the CHG and inform your nurse when you arrive at Short Stay. Do not shave (including legs and underarms) for at least 48 hours prior to the first CHG shower.  You may shave your face/neck. Please follow these instructions carefully:  1.  Shower with CHG Soap the night before surgery and the  morning of Surgery.  2.  If you choose to wash your hair, wash your hair first as usual with your  normal  shampoo.  3.  After you shampoo, rinse your hair and body thoroughly to remove the  shampoo.                           4.  Use CHG as you would any other liquid soap.  You can apply chg directly  to the skin and wash                       Gently with a scrungie or clean washcloth.  5.  Apply the CHG Soap to your body ONLY FROM THE NECK DOWN.   Do not use on face/ open                           Wound or open sores. Avoid contact with eyes, ears mouth and genitals (private parts).                       Wash face,  Genitals (private parts) with your normal soap.             6.  Wash thoroughly, paying special attention to the area where your surgery  will be performed.  7.  Thoroughly rinse your body with warm water from the neck down.  8.  DO NOT shower/wash with your normal soap after using and rinsing off  the CHG Soap.                9.  Pat yourself dry with a clean towel.            10.  Wear clean pajamas.            11.  Place clean sheets on your bed the night of your first shower and do not  sleep with pets. Day of Surgery : Do not apply any lotions/deodorants the morning of surgery.  Please wear clean clothes to the hospital/surgery center.  FAILURE TO FOLLOW THESE INSTRUCTIONS MAY RESULT IN THE CANCELLATION OF YOUR  SURGERY PATIENT SIGNATURE_________________________________  NURSE SIGNATURE__________________________________  ________________________________________________________________________

## 2018-09-09 ENCOUNTER — Encounter (HOSPITAL_COMMUNITY)
Admission: RE | Admit: 2018-09-09 | Discharge: 2018-09-09 | Disposition: A | Discharge status: Home / Self Care | Payer: BC Managed Care – PPO | Source: Ambulatory Visit | Attending: General Surgery | Admitting: General Surgery

## 2018-09-09 ENCOUNTER — Encounter (HOSPITAL_COMMUNITY): Payer: Self-pay

## 2018-09-09 ENCOUNTER — Encounter (INDEPENDENT_AMBULATORY_CARE_PROVIDER_SITE_OTHER): Payer: Self-pay

## 2018-09-09 ENCOUNTER — Other Ambulatory Visit (HOSPITAL_COMMUNITY)
Admission: RE | Admit: 2018-09-09 | Discharge: 2018-09-09 | Disposition: A | Discharge status: Home / Self Care | Payer: BC Managed Care – PPO | Source: Ambulatory Visit | Attending: General Surgery | Admitting: General Surgery

## 2018-09-09 ENCOUNTER — Other Ambulatory Visit: Payer: Self-pay

## 2018-09-09 DIAGNOSIS — E785 Hyperlipidemia, unspecified: Secondary | ICD-10-CM | POA: Insufficient documentation

## 2018-09-09 DIAGNOSIS — Z7901 Long term (current) use of anticoagulants: Secondary | ICD-10-CM | POA: Insufficient documentation

## 2018-09-09 DIAGNOSIS — I10 Essential (primary) hypertension: Secondary | ICD-10-CM | POA: Diagnosis not present

## 2018-09-09 DIAGNOSIS — Z20828 Contact with and (suspected) exposure to other viral communicable diseases: Secondary | ICD-10-CM | POA: Diagnosis not present

## 2018-09-09 DIAGNOSIS — Z01812 Encounter for preprocedural laboratory examination: Secondary | ICD-10-CM | POA: Insufficient documentation

## 2018-09-09 DIAGNOSIS — I252 Old myocardial infarction: Secondary | ICD-10-CM | POA: Insufficient documentation

## 2018-09-09 DIAGNOSIS — K7581 Nonalcoholic steatohepatitis (NASH): Secondary | ICD-10-CM | POA: Diagnosis not present

## 2018-09-09 DIAGNOSIS — Z79899 Other long term (current) drug therapy: Secondary | ICD-10-CM | POA: Diagnosis not present

## 2018-09-09 DIAGNOSIS — R7303 Prediabetes: Secondary | ICD-10-CM | POA: Diagnosis not present

## 2018-09-09 DIAGNOSIS — M1990 Unspecified osteoarthritis, unspecified site: Secondary | ICD-10-CM | POA: Diagnosis not present

## 2018-09-09 DIAGNOSIS — Z6841 Body Mass Index (BMI) 40.0 and over, adult: Secondary | ICD-10-CM | POA: Insufficient documentation

## 2018-09-09 DIAGNOSIS — Z86718 Personal history of other venous thrombosis and embolism: Secondary | ICD-10-CM | POA: Insufficient documentation

## 2018-09-09 HISTORY — DX: Nausea with vomiting, unspecified: R11.2

## 2018-09-09 HISTORY — DX: Other specified postprocedural states: Z98.890

## 2018-09-09 HISTORY — DX: Other complications of anesthesia, initial encounter: T88.59XA

## 2018-09-09 HISTORY — DX: Unspecified cirrhosis of liver: K74.60

## 2018-09-09 LAB — CBC WITH DIFFERENTIAL/PLATELET
Abs Immature Granulocytes: 0.01 10*3/uL (ref 0.00–0.07)
Basophils Absolute: 0 10*3/uL (ref 0.0–0.1)
Basophils Relative: 1 %
Eosinophils Absolute: 0.1 10*3/uL (ref 0.0–0.5)
Eosinophils Relative: 3 %
HCT: 45.2 % (ref 36.0–46.0)
Hemoglobin: 14.8 g/dL (ref 12.0–15.0)
Immature Granulocytes: 0 %
Lymphocytes Relative: 39 %
Lymphs Abs: 1.5 10*3/uL (ref 0.7–4.0)
MCH: 30.5 pg (ref 26.0–34.0)
MCHC: 32.7 g/dL (ref 30.0–36.0)
MCV: 93.2 fL (ref 80.0–100.0)
Monocytes Absolute: 0.5 10*3/uL (ref 0.1–1.0)
Monocytes Relative: 13 %
Neutro Abs: 1.7 10*3/uL (ref 1.7–7.7)
Neutrophils Relative %: 44 %
Platelets: 146 10*3/uL — ABNORMAL LOW (ref 150–400)
RBC: 4.85 MIL/uL (ref 3.87–5.11)
RDW: 13.9 % (ref 11.5–15.5)
WBC: 3.8 10*3/uL — ABNORMAL LOW (ref 4.0–10.5)
nRBC: 0 % (ref 0.0–0.2)

## 2018-09-09 LAB — COMPREHENSIVE METABOLIC PANEL
ALT: 124 U/L — ABNORMAL HIGH (ref 0–44)
AST: 137 U/L — ABNORMAL HIGH (ref 15–41)
Albumin: 3.8 g/dL (ref 3.5–5.0)
Alkaline Phosphatase: 109 U/L (ref 38–126)
Anion gap: 9 (ref 5–15)
BUN: 15 mg/dL (ref 6–20)
CO2: 24 mmol/L (ref 22–32)
Calcium: 9.4 mg/dL (ref 8.9–10.3)
Chloride: 105 mmol/L (ref 98–111)
Creatinine, Ser: 0.85 mg/dL (ref 0.44–1.00)
GFR calc Af Amer: >60 mL/min (ref >60)
GFR calc non Af Amer: >60 mL/min (ref >60)
Glucose, Bld: 108 mg/dL — ABNORMAL HIGH (ref 70–99)
Potassium: 4.2 mmol/L (ref 3.5–5.1)
Sodium: 138 mmol/L (ref 135–145)
Total Bilirubin: 1.6 mg/dL — ABNORMAL HIGH (ref 0.3–1.2)
Total Protein: 8 g/dL (ref 6.5–8.1)

## 2018-09-09 LAB — ABO/RH: ABO/RH(D): A POS

## 2018-09-09 LAB — SARS CORONAVIRUS 2 (TAT 6-24 HRS): SARS Coronavirus 2: NEGATIVE

## 2018-09-12 ENCOUNTER — Encounter (HOSPITAL_COMMUNITY): Payer: Self-pay | Admitting: Certified Registered"

## 2018-09-12 MED ORDER — BUPIVACAINE LIPOSOME 1.3 % IJ SUSP
20.0000 mL | Freq: Once | INTRAMUSCULAR | Status: DC
  Filled 2018-09-12: qty 20

## 2018-09-12 NOTE — Progress Notes (Signed)
Anesthesia Chart Review   Case: [id number] Date/Time: 09/13/18 0715   Procedure: LAPAROSCOPIC GASTRIC SLEEVE RESECTION, UPPER ENDO, ERAS Pathway (N/A )   Anesthesia type: General   Pre-op diagnosis: Morbid Obesity, HTN, OSA, NASH, Osteoarthritis, Pre Diabetes   Location: [facility] ROOM 01 / [facility]   Surgeon: Greer Pickerel, MD      DISCUSSION:58 y.o. 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 Greer Pickerel.    Pt last seen by cardiologist, Dr. Darcel Bayley, 08/19/2018.  Per OV note, "Stable shortness of breath. She has DVT/PE last year for which she is on Xarelto.  Normal coronaries in 2014.  She should be at low cardiac risk for her gastric bypass surgery.  Normal LVEF by 2D echo last year."  Anticipate pt can proceed with planned procedure barring acute status change.   VS: BP 126/66   Pulse (!) 59   Temp 37 C (Oral)   Resp 18   Ht 5\' 4"  (1.626 m)   Wt (!) 140.6 kg   SpO2 99%   BMI 53.21 kg/m   PROVIDERS: Blythe Stanford., MD is PCP last seen 08/18/2018  Helene Kelp, MD is Cardiologist   Darlyne Russian, MD is Gastroenterologist   Verner Chol, MD is Hematologist  LABS: Labs reviewed: Acceptable for surgery. and AST ALT chronically elevated (all labs ordered are listed, but only abnormal results are displayed)  Labs Reviewed  CBC WITH DIFFERENTIAL/PLATELET - Abnormal; Notable for the following components:      Result Value   WBC 3.8 (*)    Platelets 146 (*)    All other components within normal limits  COMPREHENSIVE METABOLIC PANEL - Abnormal; Notable for the following components:   Glucose, Bld 108 (*)    AST 137 (*)    ALT 124 (*)    Total Bilirubin 1.6 (*)    All other components within normal limits  TYPE AND SCREEN  ABO/RH     IMAGES:   EKG: 10/04/17 Rate 58 bpm Sinus bradycardia with Premature atrial complexes Low voltage QRS, consider  pulmonary disease or obesity Otherwise normal  CV: Stress Test 02/21/2018 SUMMARY The patient had chest pain in both stress and recovery The patient had 5-6/10 chest pain. The patient achieved 86 % of maximum predicted heart rate. Normal left ventricular function and global wall motion with stress. Negative dobutamine echocardiography for inducible ischemia at target heart  rate. Negative stress ECG for inducible ischemia at target heart rate.  Echo 08/29/17 Interpretation Summary A complete two-dimensional transthoracic echocardiogram was performed (2D, M-mode, Doppler and color flow Doppler). The study was technically difficult. The study was technically difficult. Regional wall motion abnormalities cannot be excluded due to limited visualization. Left ventricular systolic function is normal. Ejection Fraction = >55%. The right ventricular systolic function is mildly reduced. Left Ventricle Left ventricular systolic function is normal. Ejection Fraction = >55%. Regional wall motion abnormalities cannot be excluded due to limited visualization. Right Ventricle The right ventricular systolic function is mildly reduced.  Past Medical History:  Diagnosis Date  . Apnea   . 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 Complication of anesthesia   . Hyperlipidemia   . Hypertension   . Myocardial infarction ([place])   . PONV (postoperative nausea and vomiting)     Past Surgical History:  Procedure Laterality Date  . CARPAL TUNNEL RELEASE    .  CHOLECYSTECTOMY    . ROTATOR CUFF REPAIR      MEDICATIONS: . butalbital-acetaminophen-caffeine (FIORICET) 50-325-40 MG tablet  . Cholecalciferol (D 5000) 125 MCG (5000 UT) capsule  . cyclobenzaprine (FLEXERIL) 10 MG tablet  . diclofenac sodium (VOLTAREN) 1 % GEL  . fluticasone (FLONASE) 50 MCG/ACT nasal spray  . gabapentin (NEURONTIN) 300 MG capsule  . losartan-hydrochlorothiazide (HYZAAR) 50-12.5 MG tablet  . meclizine  (ANTIVERT) 25 MG tablet  . montelukast (SINGULAIR) 10 MG tablet  . omeprazole (PRILOSEC) 40 MG capsule  . potassium chloride (K-DUR) 10 MEQ tablet  . simvastatin (ZOCOR) 10 MG tablet  . traZODone (DESYREL) 50 MG tablet  . XARELTO 20 MG TABS tablet   No current facility-administered medications for this encounter.    Melene Muller. [START ON 09/13/2018] bupivacaine liposome (EXPAREL) 1.3 % injection 266 mg    Janey GentaJessica Millie Shorb, PA-C [facility] [phone] 09/12/18  9:19 AM

## 2018-09-12 NOTE — Progress Notes (Signed)
Per call from lab, T&S needs to be recollected on day of surgery. Order placed.

## 2018-09-12 NOTE — Anesthesia Preprocedure Evaluation (Addendum)
Anesthesia Evaluation  Patient identified by MRN, date of birth, ID band Patient awake    Reviewed: Allergy & Precautions, H&P , NPO status , Patient's Chart, lab work & pertinent test results  History of Anesthesia Complications (+) PONV  Airway Mallampati: III  TM Distance: >3 FB Neck ROM: Full    Dental no notable dental hx. (+) Teeth Intact, Dental Advisory Given   Pulmonary neg pulmonary ROS, PE   Pulmonary exam normal breath sounds clear to auscultation       Cardiovascular Exercise Tolerance: Good hypertension, Pt. on medications  Rhythm:Regular Rate:Normal     Neuro/Psych negative neurological ROS  negative psych ROS   GI/Hepatic negative GI ROS, Neg liver ROS,   Endo/Other  Morbid obesity  Renal/GU negative Renal ROS  negative genitourinary   Musculoskeletal   Abdominal   Peds  Hematology negative hematology ROS (+)   Anesthesia Other Findings   Reproductive/Obstetrics negative OB ROS                            Anesthesia Physical Anesthesia Plan  ASA: III  Anesthesia Plan: General   Post-op Pain Management:    Induction: Intravenous  PONV Risk Score and Plan: 4 or greater and Ondansetron, Dexamethasone, Midazolam and Scopolamine patch - Pre-op  Airway Management Planned: Oral ETT and Video Laryngoscope Planned  Additional Equipment:   Intra-op Plan:   Post-operative Plan: Extubation in OR  Informed Consent: I have reviewed the patients History and Physical, chart, labs and discussed the procedure including the risks, benefits and alternatives for the proposed anesthesia with the patient or authorized representative who has indicated his/her understanding and acceptance.     Dental advisory given  Plan Discussed with: CRNA  Anesthesia Plan Comments: (See PAT note 09/09/2018, Konrad Felix, PA-C)      Anesthesia Quick Evaluation

## 2018-09-13 ENCOUNTER — Inpatient Hospital Stay (HOSPITAL_COMMUNITY): Payer: BC Managed Care – PPO | Admitting: Physician Assistant

## 2018-09-13 ENCOUNTER — Encounter (HOSPITAL_COMMUNITY)
Admission: RE | Disposition: A | Discharge status: Home / Self Care | Payer: Self-pay | Source: Home / Self Care | Attending: General Surgery

## 2018-09-13 ENCOUNTER — Encounter (HOSPITAL_COMMUNITY): Payer: Self-pay

## 2018-09-13 ENCOUNTER — Inpatient Hospital Stay (HOSPITAL_COMMUNITY)
Admission: RE | Admit: 2018-09-13 | Discharge: 2018-09-14 | DRG: 982 | Disposition: A | Discharge status: Home / Self Care | Payer: BC Managed Care – PPO | Attending: General Surgery | Admitting: General Surgery

## 2018-09-13 ENCOUNTER — Inpatient Hospital Stay (HOSPITAL_COMMUNITY): Payer: BC Managed Care – PPO | Admitting: Certified Registered"

## 2018-09-13 ENCOUNTER — Other Ambulatory Visit: Payer: Self-pay

## 2018-09-13 DIAGNOSIS — K219 Gastro-esophageal reflux disease without esophagitis: Secondary | ICD-10-CM | POA: Diagnosis present

## 2018-09-13 DIAGNOSIS — K7469 Other cirrhosis of liver: Secondary | ICD-10-CM | POA: Diagnosis present

## 2018-09-13 DIAGNOSIS — Z7901 Long term (current) use of anticoagulants: Secondary | ICD-10-CM

## 2018-09-13 DIAGNOSIS — E662 Morbid (severe) obesity with alveolar hypoventilation: Secondary | ICD-10-CM | POA: Diagnosis present

## 2018-09-13 DIAGNOSIS — K420 Umbilical hernia with obstruction, without gangrene: Secondary | ICD-10-CM | POA: Diagnosis present

## 2018-09-13 DIAGNOSIS — Z79899 Other long term (current) drug therapy: Secondary | ICD-10-CM

## 2018-09-13 DIAGNOSIS — E7800 Pure hypercholesterolemia, unspecified: Secondary | ICD-10-CM | POA: Diagnosis present

## 2018-09-13 DIAGNOSIS — Z86711 Personal history of pulmonary embolism: Secondary | ICD-10-CM | POA: Diagnosis not present

## 2018-09-13 DIAGNOSIS — M722 Plantar fascial fibromatosis: Secondary | ICD-10-CM | POA: Diagnosis present

## 2018-09-13 DIAGNOSIS — Z9049 Acquired absence of other specified parts of digestive tract: Secondary | ICD-10-CM | POA: Diagnosis not present

## 2018-09-13 DIAGNOSIS — Z87442 Personal history of urinary calculi: Secondary | ICD-10-CM | POA: Diagnosis not present

## 2018-09-13 DIAGNOSIS — I252 Old myocardial infarction: Secondary | ICD-10-CM

## 2018-09-13 DIAGNOSIS — Z9884 Bariatric surgery status: Secondary | ICD-10-CM

## 2018-09-13 DIAGNOSIS — Z6841 Body Mass Index (BMI) 40.0 and over, adult: Secondary | ICD-10-CM | POA: Diagnosis not present

## 2018-09-13 DIAGNOSIS — D696 Thrombocytopenia, unspecified: Secondary | ICD-10-CM | POA: Diagnosis present

## 2018-09-13 DIAGNOSIS — I10 Essential (primary) hypertension: Secondary | ICD-10-CM | POA: Diagnosis present

## 2018-09-13 DIAGNOSIS — Z86010 Personal history of colonic polyps: Secondary | ICD-10-CM | POA: Diagnosis not present

## 2018-09-13 DIAGNOSIS — Z23 Encounter for immunization: Secondary | ICD-10-CM

## 2018-09-13 DIAGNOSIS — K5790 Diverticulosis of intestine, part unspecified, without perforation or abscess without bleeding: Secondary | ICD-10-CM | POA: Diagnosis present

## 2018-09-13 DIAGNOSIS — Z882 Allergy status to sulfonamides status: Secondary | ICD-10-CM | POA: Diagnosis not present

## 2018-09-13 DIAGNOSIS — G43909 Migraine, unspecified, not intractable, without status migrainosus: Secondary | ICD-10-CM | POA: Diagnosis present

## 2018-09-13 DIAGNOSIS — K649 Unspecified hemorrhoids: Secondary | ICD-10-CM | POA: Diagnosis present

## 2018-09-13 DIAGNOSIS — K7581 Nonalcoholic steatohepatitis (NASH): Secondary | ICD-10-CM | POA: Diagnosis present

## 2018-09-13 DIAGNOSIS — G4733 Obstructive sleep apnea (adult) (pediatric): Secondary | ICD-10-CM

## 2018-09-13 DIAGNOSIS — Z90711 Acquired absence of uterus with remaining cervical stump: Secondary | ICD-10-CM

## 2018-09-13 DIAGNOSIS — Z9989 Dependence on other enabling machines and devices: Secondary | ICD-10-CM

## 2018-09-13 DIAGNOSIS — R7303 Prediabetes: Secondary | ICD-10-CM | POA: Diagnosis present

## 2018-09-13 DIAGNOSIS — K760 Fatty (change of) liver, not elsewhere classified: Secondary | ICD-10-CM | POA: Diagnosis present

## 2018-09-13 HISTORY — PX: LAPAROSCOPIC GASTRIC SLEEVE RESECTION: SHX5895

## 2018-09-13 LAB — TYPE AND SCREEN
ABO/RH(D): A POS
Antibody Screen: POSITIVE
Unit division: 0
Unit division: 0

## 2018-09-13 LAB — BPAM RBC
Blood Product Expiration Date: 202008242359
Blood Product Expiration Date: 202008242359
Unit Type and Rh: 6200
Unit Type and Rh: 6200

## 2018-09-13 LAB — HEMOGLOBIN AND HEMATOCRIT, BLOOD
HCT: 42.7 % (ref 36.0–46.0)
Hemoglobin: 13.9 g/dL (ref 12.0–15.0)

## 2018-09-13 LAB — PROTIME-INR
INR: 1 (ref 0.8–1.2)
Prothrombin Time: 13.4 seconds (ref 11.4–15.2)

## 2018-09-13 LAB — GLUCOSE, CAPILLARY
Glucose-Capillary: 118 mg/dL — ABNORMAL HIGH (ref 70–99)
Glucose-Capillary: 155 mg/dL — ABNORMAL HIGH (ref 70–99)
Glucose-Capillary: 172 mg/dL — ABNORMAL HIGH (ref 70–99)
Glucose-Capillary: 175 mg/dL — ABNORMAL HIGH (ref 70–99)

## 2018-09-13 SURGERY — GASTRECTOMY, SLEEVE, LAPAROSCOPIC
Anesthesia: General

## 2018-09-13 MED ORDER — ENOXAPARIN SODIUM 40 MG/0.4ML ~~LOC~~ SOLN
40.0000 mg | SUBCUTANEOUS | Status: AC
  Administered 2018-09-13: 40 mg via SUBCUTANEOUS
  Filled 2018-09-13: qty 0.4

## 2018-09-13 MED ORDER — KCL IN DEXTROSE-NACL 20-5-0.45 MEQ/L-%-% IV SOLN
INTRAVENOUS | Status: DC
  Administered 2018-09-13 – 2018-09-14 (×3): via INTRAVENOUS
  Filled 2018-09-13 (×3): qty 1000

## 2018-09-13 MED ORDER — FENTANYL CITRATE (PF) 250 MCG/5ML IJ SOLN
INTRAMUSCULAR | Status: DC | PRN
  Administered 2018-09-13 (×3): 50 ug via INTRAVENOUS

## 2018-09-13 MED ORDER — SODIUM CHLORIDE 0.9 % IV SOLN
INTRAVENOUS | Status: DC | PRN
  Administered 2018-09-13: 50 ug/min via INTRAVENOUS

## 2018-09-13 MED ORDER — MECLIZINE HCL 25 MG PO TABS: 25.0000 mg | ORAL_TABLET | Freq: Three times a day (TID) | ORAL | Status: DC | PRN

## 2018-09-13 MED ORDER — LOSARTAN POTASSIUM 50 MG PO TABS
50.0000 mg | ORAL_TABLET | Freq: Every day | ORAL | Status: DC
  Administered 2018-09-14: 50 mg via ORAL
  Filled 2018-09-13: qty 1

## 2018-09-13 MED ORDER — GABAPENTIN 300 MG PO CAPS
300.0000 mg | ORAL_CAPSULE | ORAL | Status: AC
  Administered 2018-09-13: 300 mg via ORAL
  Filled 2018-09-13: qty 1

## 2018-09-13 MED ORDER — ENOXAPARIN (LOVENOX) PATIENT EDUCATION KIT
PACK | Freq: Once | Status: AC
  Administered 2018-09-13: 14:00:00
  Filled 2018-09-13: qty 1

## 2018-09-13 MED ORDER — MIDAZOLAM HCL 2 MG/2ML IJ SOLN
INTRAMUSCULAR | Status: AC
  Filled 2018-09-13: qty 2

## 2018-09-13 MED ORDER — KETAMINE HCL 10 MG/ML IJ SOLN
INTRAMUSCULAR | Status: DC | PRN
  Administered 2018-09-13: 20 mg via INTRAVENOUS

## 2018-09-13 MED ORDER — KETOROLAC TROMETHAMINE 15 MG/ML IJ SOLN
INTRAMUSCULAR | Status: AC
  Administered 2018-09-13: 15 mg via INTRAVENOUS
  Filled 2018-09-13: qty 1

## 2018-09-13 MED ORDER — CYCLOBENZAPRINE HCL 10 MG PO TABS
10.0000 mg | ORAL_TABLET | Freq: Three times a day (TID) | ORAL | Status: DC | PRN
  Filled 2018-09-13: qty 1

## 2018-09-13 MED ORDER — HYDROCHLOROTHIAZIDE 12.5 MG PO CAPS
12.5000 mg | ORAL_CAPSULE | Freq: Every day | ORAL | Status: DC
  Administered 2018-09-14: 12.5 mg via ORAL
  Filled 2018-09-13: qty 1

## 2018-09-13 MED ORDER — HYDROMORPHONE HCL 1 MG/ML IJ SOLN
INTRAMUSCULAR | Status: AC
  Administered 2018-09-13: 0.25 mg via INTRAVENOUS
  Filled 2018-09-13: qty 1

## 2018-09-13 MED ORDER — LACTATED RINGERS IR SOLN
Status: DC | PRN
  Administered 2018-09-13: 1

## 2018-09-13 MED ORDER — FLUTICASONE PROPIONATE 50 MCG/ACT NA SUSP
2.0000 | Freq: Every day | NASAL | Status: DC | PRN
  Filled 2018-09-13: qty 16

## 2018-09-13 MED ORDER — INSULIN ASPART 100 UNIT/ML ~~LOC~~ SOLN
0.0000 [IU] | SUBCUTANEOUS | Status: DC
  Administered 2018-09-13 – 2018-09-14 (×4): 3 [IU] via SUBCUTANEOUS
  Administered 2018-09-14: 2 [IU] via SUBCUTANEOUS

## 2018-09-13 MED ORDER — MORPHINE SULFATE (PF) 4 MG/ML IV SOLN
1.0000 mg | INTRAVENOUS | Status: DC | PRN
  Administered 2018-09-13: 2 mg via INTRAVENOUS
  Filled 2018-09-13: qty 1

## 2018-09-13 MED ORDER — FENTANYL CITRATE (PF) 250 MCG/5ML IJ SOLN
INTRAMUSCULAR | Status: AC
  Filled 2018-09-13: qty 5

## 2018-09-13 MED ORDER — GABAPENTIN 300 MG PO CAPS
300.0000 mg | ORAL_CAPSULE | Freq: Two times a day (BID) | ORAL | Status: DC
  Administered 2018-09-13 – 2018-09-14 (×2): 300 mg via ORAL
  Filled 2018-09-13 (×2): qty 1

## 2018-09-13 MED ORDER — TRAZODONE HCL 50 MG PO TABS
150.0000 mg | ORAL_TABLET | Freq: Every day | ORAL | Status: DC
  Administered 2018-09-13: 21:00:00 150 mg via ORAL
  Filled 2018-09-13: qty 1

## 2018-09-13 MED ORDER — ONDANSETRON HCL 4 MG/2ML IJ SOLN
INTRAMUSCULAR | Status: DC | PRN
  Administered 2018-09-13: 4 mg via INTRAVENOUS

## 2018-09-13 MED ORDER — 0.9 % SODIUM CHLORIDE (POUR BTL) OPTIME
TOPICAL | Status: DC | PRN
  Administered 2018-09-13: 08:00:00 1000 mL

## 2018-09-13 MED ORDER — LOSARTAN POTASSIUM-HCTZ 50-12.5 MG PO TABS: 1.0000 | ORAL_TABLET | Freq: Every day | ORAL | Status: DC

## 2018-09-13 MED ORDER — EPHEDRINE SULFATE-NACL 50-0.9 MG/10ML-% IV SOSY
PREFILLED_SYRINGE | INTRAVENOUS | Status: DC | PRN
  Administered 2018-09-13: 15 mg via INTRAVENOUS
  Administered 2018-09-13: 10 mg via INTRAVENOUS

## 2018-09-13 MED ORDER — HYDROMORPHONE HCL 1 MG/ML IJ SOLN
0.2500 mg | INTRAMUSCULAR | Status: DC | PRN
  Administered 2018-09-13 (×4): 0.25 mg via INTRAVENOUS

## 2018-09-13 MED ORDER — SODIUM CHLORIDE 0.9 % IV SOLN
2.0000 g | INTRAVENOUS | Status: AC
  Administered 2018-09-13: 2 g via INTRAVENOUS
  Filled 2018-09-13: qty 2

## 2018-09-13 MED ORDER — ONDANSETRON HCL 4 MG/2ML IJ SOLN: 4.0000 mg | Freq: Four times a day (QID) | INTRAMUSCULAR | Status: DC | PRN

## 2018-09-13 MED ORDER — SODIUM CHLORIDE (PF) 0.9 % IJ SOLN
INTRAMUSCULAR | Status: AC
  Filled 2018-09-13: qty 50

## 2018-09-13 MED ORDER — ENOXAPARIN SODIUM 30 MG/0.3ML ~~LOC~~ SOLN
30.0000 mg | Freq: Two times a day (BID) | SUBCUTANEOUS | Status: DC
  Administered 2018-09-13 – 2018-09-14 (×2): 30 mg via SUBCUTANEOUS
  Filled 2018-09-13 (×2): qty 0.3

## 2018-09-13 MED ORDER — SODIUM CHLORIDE (PF) 0.9 % IJ SOLN
INTRAMUSCULAR | Status: DC | PRN
  Administered 2018-09-13: 50 mL

## 2018-09-13 MED ORDER — ACETAMINOPHEN 500 MG PO TABS
1000.0000 mg | ORAL_TABLET | ORAL | Status: AC
  Administered 2018-09-13: 1000 mg via ORAL
  Filled 2018-09-13: qty 2

## 2018-09-13 MED ORDER — STERILE WATER FOR IRRIGATION IR SOLN
Status: DC | PRN
  Administered 2018-09-13: 2000 mL

## 2018-09-13 MED ORDER — BUPIVACAINE LIPOSOME 1.3 % IJ SUSP
INTRAMUSCULAR | Status: DC | PRN
  Administered 2018-09-13: 20 mL

## 2018-09-13 MED ORDER — LIDOCAINE 2% (20 MG/ML) 5 ML SYRINGE
INTRAMUSCULAR | Status: DC | PRN
  Administered 2018-09-13: 60 mg via INTRAVENOUS
  Administered 2018-09-13: 40 mg via INTRAVENOUS

## 2018-09-13 MED ORDER — ENSURE MAX PROTEIN PO LIQD
2.0000 [oz_av] | ORAL | Status: DC
  Administered 2018-09-14 (×5): 2 [oz_av] via ORAL

## 2018-09-13 MED ORDER — DEXAMETHASONE SODIUM PHOSPHATE 10 MG/ML IJ SOLN
INTRAMUSCULAR | Status: DC | PRN
  Administered 2018-09-13: 5 mg via INTRAVENOUS

## 2018-09-13 MED ORDER — OXYCODONE HCL 5 MG/5ML PO SOLN
5.0000 mg | Freq: Four times a day (QID) | ORAL | Status: DC | PRN
  Administered 2018-09-13 – 2018-09-14 (×2): 5 mg via ORAL
  Filled 2018-09-13 (×2): qty 5

## 2018-09-13 MED ORDER — ACETAMINOPHEN 500 MG PO TABS
1000.0000 mg | ORAL_TABLET | Freq: Three times a day (TID) | ORAL | Status: DC
  Administered 2018-09-14: 1000 mg via ORAL
  Filled 2018-09-13: qty 2

## 2018-09-13 MED ORDER — CHLORHEXIDINE GLUCONATE 4 % EX LIQD: 60.0000 mL | Freq: Once | CUTANEOUS | Status: DC

## 2018-09-13 MED ORDER — APREPITANT 40 MG PO CAPS
40.0000 mg | ORAL_CAPSULE | ORAL | Status: AC
  Administered 2018-09-13: 40 mg via ORAL
  Filled 2018-09-13: qty 1

## 2018-09-13 MED ORDER — LIDOCAINE 20MG/ML (2%) 15 ML SYRINGE OPTIME
INTRAMUSCULAR | Status: DC | PRN
  Administered 2018-09-13: 1.5 mg/kg/h via INTRAVENOUS

## 2018-09-13 MED ORDER — KETOROLAC TROMETHAMINE 15 MG/ML IJ SOLN
15.0000 mg | Freq: Three times a day (TID) | INTRAMUSCULAR | Status: DC | PRN
  Administered 2018-09-13 – 2018-09-14 (×2): 15 mg via INTRAVENOUS
  Filled 2018-09-13: qty 1

## 2018-09-13 MED ORDER — ROCURONIUM BROMIDE 10 MG/ML (PF) SYRINGE
PREFILLED_SYRINGE | INTRAVENOUS | Status: DC | PRN
  Administered 2018-09-13: 50 mg via INTRAVENOUS
  Administered 2018-09-13: 20 mg via INTRAVENOUS

## 2018-09-13 MED ORDER — SUCCINYLCHOLINE CHLORIDE 200 MG/10ML IV SOSY
PREFILLED_SYRINGE | INTRAVENOUS | Status: DC | PRN
  Administered 2018-09-13: 120 mg via INTRAVENOUS
  Administered 2018-09-13: 50 mg via INTRAVENOUS

## 2018-09-13 MED ORDER — SIMETHICONE 80 MG PO CHEW
80.0000 mg | CHEWABLE_TABLET | Freq: Four times a day (QID) | ORAL | Status: DC | PRN
  Administered 2018-09-14: 80 mg via ORAL
  Filled 2018-09-13: qty 1

## 2018-09-13 MED ORDER — MIDAZOLAM HCL 2 MG/2ML IJ SOLN
INTRAMUSCULAR | Status: DC | PRN
  Administered 2018-09-13 (×2): 1 mg via INTRAVENOUS

## 2018-09-13 MED ORDER — DIPHENHYDRAMINE HCL 50 MG/ML IJ SOLN: 12.5000 mg | Freq: Three times a day (TID) | INTRAMUSCULAR | Status: DC | PRN

## 2018-09-13 MED ORDER — KETAMINE HCL 10 MG/ML IJ SOLN
INTRAMUSCULAR | Status: AC
  Filled 2018-09-13: qty 1

## 2018-09-13 MED ORDER — PROPOFOL 10 MG/ML IV BOLUS
INTRAVENOUS | Status: AC
  Filled 2018-09-13: qty 40

## 2018-09-13 MED ORDER — PROMETHAZINE HCL 25 MG/ML IJ SOLN
6.2500 mg | INTRAMUSCULAR | Status: DC | PRN
  Administered 2018-09-13: 10:00:00 6.25 mg via INTRAVENOUS

## 2018-09-13 MED ORDER — PANTOPRAZOLE SODIUM 40 MG IV SOLR
40.0000 mg | Freq: Every day | INTRAVENOUS | Status: DC
  Administered 2018-09-13: 40 mg via INTRAVENOUS
  Filled 2018-09-13: qty 40

## 2018-09-13 MED ORDER — LIP MEDEX EX OINT
TOPICAL_OINTMENT | CUTANEOUS | Status: AC
  Filled 2018-09-13: qty 7

## 2018-09-13 MED ORDER — LACTATED RINGERS IV SOLN
INTRAVENOUS | Status: DC
  Administered 2018-09-13 (×2): via INTRAVENOUS

## 2018-09-13 MED ORDER — SCOPOLAMINE 1 MG/3DAYS TD PT72
1.0000 | MEDICATED_PATCH | TRANSDERMAL | Status: DC
  Administered 2018-09-13: 1.5 mg via TRANSDERMAL
  Filled 2018-09-13: qty 1

## 2018-09-13 MED ORDER — DEXAMETHASONE SODIUM PHOSPHATE 4 MG/ML IJ SOLN: 4.0000 mg | INTRAMUSCULAR | Status: DC

## 2018-09-13 MED ORDER — ACETAMINOPHEN 160 MG/5ML PO SOLN
1000.0000 mg | Freq: Three times a day (TID) | ORAL | Status: DC
  Administered 2018-09-13 – 2018-09-14 (×2): 1000 mg via ORAL
  Filled 2018-09-13 (×2): qty 40.6

## 2018-09-13 MED ORDER — PROMETHAZINE HCL 25 MG/ML IJ SOLN: 12.5000 mg | Freq: Four times a day (QID) | INTRAMUSCULAR | Status: DC | PRN

## 2018-09-13 MED ORDER — PROMETHAZINE HCL 25 MG/ML IJ SOLN
INTRAMUSCULAR | Status: AC
  Administered 2018-09-13: 6.25 mg via INTRAVENOUS
  Filled 2018-09-13: qty 1

## 2018-09-13 MED ORDER — SUGAMMADEX SODIUM 200 MG/2ML IV SOLN
INTRAVENOUS | Status: DC | PRN
  Administered 2018-09-13: 500 mg via INTRAVENOUS

## 2018-09-13 MED ORDER — PROPOFOL 10 MG/ML IV BOLUS
INTRAVENOUS | Status: DC | PRN
  Administered 2018-09-13: 150 mg via INTRAVENOUS

## 2018-09-13 MED ORDER — PHENYLEPHRINE 40 MCG/ML (10ML) SYRINGE FOR IV PUSH (FOR BLOOD PRESSURE SUPPORT)
PREFILLED_SYRINGE | INTRAVENOUS | Status: DC | PRN
  Administered 2018-09-13: 120 ug via INTRAVENOUS
  Administered 2018-09-13: 80 ug via INTRAVENOUS

## 2018-09-13 SURGICAL SUPPLY — 69 items
APPLICATOR COTTON TIP 6 STRL (MISCELLANEOUS) IMPLANT
APPLICATOR COTTON TIP 6IN STRL (MISCELLANEOUS)
APPLIER CLIP ROT 10 11.4 M/L (STAPLE)
APPLIER CLIP ROT 13.4 12 LRG (CLIP)
BENZOIN TINCTURE PRP APPL 2/3 (GAUZE/BANDAGES/DRESSINGS) ×2 IMPLANT
BLADE SURG SZ11 CARB STEEL (BLADE) ×2 IMPLANT
BNDG ADH 1X3 SHEER STRL LF (GAUZE/BANDAGES/DRESSINGS) ×12 IMPLANT
CABLE HIGH FREQUENCY MONO STRZ (ELECTRODE) ×2 IMPLANT
CHLORAPREP W/TINT 26 (MISCELLANEOUS) ×4 IMPLANT
CLIP APPLIE ROT 10 11.4 M/L (STAPLE) IMPLANT
CLIP APPLIE ROT 13.4 12 LRG (CLIP) IMPLANT
COVER SURGICAL LIGHT HANDLE (MISCELLANEOUS) ×2 IMPLANT
COVER WAND RF STERILE (DRAPES) IMPLANT
DECANTER SPIKE VIAL GLASS SM (MISCELLANEOUS) ×2 IMPLANT
DEVICE SUT QUICK LOAD TK 5 (STAPLE) IMPLANT
DEVICE SUT TI-KNOT TK 5X26 (MISCELLANEOUS) IMPLANT
DEVICE SUTURE ENDOST 10MM (ENDOMECHANICALS) IMPLANT
DISSECTOR BLUNT TIP ENDO 5MM (MISCELLANEOUS) IMPLANT
DRAPE UTILITY XL STRL (DRAPES) ×4 IMPLANT
ELECT L-HOOK LAP 45CM DISP (ELECTROSURGICAL)
ELECT PENCIL ROCKER SW 15FT (MISCELLANEOUS) IMPLANT
ELECT REM PT RETURN 15FT ADLT (MISCELLANEOUS) ×2 IMPLANT
ELECTRODE L-HOOK LAP 45CM DISP (ELECTROSURGICAL) IMPLANT
GAUZE SPONGE 2X2 8PLY STRL LF (GAUZE/BANDAGES/DRESSINGS) IMPLANT
GAUZE SPONGE 4X4 12PLY STRL (GAUZE/BANDAGES/DRESSINGS) IMPLANT
GLOVE BIO SURGEON STRL SZ7.5 (GLOVE) ×2 IMPLANT
GLOVE INDICATOR 8.0 STRL GRN (GLOVE) ×2 IMPLANT
GOWN STRL REUS W/TWL XL LVL3 (GOWN DISPOSABLE) ×6 IMPLANT
GRASPER SUT TROCAR 14GX15 (MISCELLANEOUS) ×2 IMPLANT
HOVERMATT SINGLE USE (MISCELLANEOUS) ×2 IMPLANT
KIT BASIN OR (CUSTOM PROCEDURE TRAY) ×2 IMPLANT
KIT TURNOVER KIT A (KITS) IMPLANT
MARKER SKIN DUAL TIP RULER LAB (MISCELLANEOUS) ×2 IMPLANT
NEEDLE SPNL 22GX3.5 QUINCKE BK (NEEDLE) ×2 IMPLANT
PACK UNIVERSAL I (CUSTOM PROCEDURE TRAY) ×2 IMPLANT
PENCIL SMOKE EVACUATOR (MISCELLANEOUS) IMPLANT
RELOAD STAPLER 60MM BLK (STAPLE) ×1 IMPLANT
RELOAD STAPLER BLUE 60MM (STAPLE) ×2 IMPLANT
RELOAD STAPLER GOLD 60MM (STAPLE) ×1 IMPLANT
RELOAD STAPLER GREEN 60MM (STAPLE) ×1 IMPLANT
SCISSORS LAP 5X45 EPIX DISP (ENDOMECHANICALS) IMPLANT
SEALANT SURGICAL APPL DUAL CAN (MISCELLANEOUS) IMPLANT
SET IRRIG TUBING LAPAROSCOPIC (IRRIGATION / IRRIGATOR) ×2 IMPLANT
SET TUBE SMOKE EVAC HIGH FLOW (TUBING) ×2 IMPLANT
SHEARS HARMONIC ACE PLUS 45CM (MISCELLANEOUS) ×2 IMPLANT
SLEEVE GASTRECTOMY 40FR VISIGI (MISCELLANEOUS) ×2 IMPLANT
SLEEVE XCEL OPT CAN 5 100 (ENDOMECHANICALS) ×6 IMPLANT
SOLUTION ANTI FOG 6CC (MISCELLANEOUS) ×2 IMPLANT
SPONGE GAUZE 2X2 STER 10/PKG (GAUZE/BANDAGES/DRESSINGS)
SPONGE LAP 18X18 RF (DISPOSABLE) ×2 IMPLANT
STAPLER ECHELON BIOABSB 60 FLE (MISCELLANEOUS) ×10 IMPLANT
STAPLER ECHELON LONG 60 440 (INSTRUMENTS) ×2 IMPLANT
STAPLER RELOAD 60MM BLK (STAPLE) ×2
STAPLER RELOAD BLUE 60MM (STAPLE) ×4
STAPLER RELOAD GOLD 60MM (STAPLE) ×2
STAPLER RELOAD GREEN 60MM (STAPLE) ×2
STRIP CLOSURE SKIN 1/2X4 (GAUZE/BANDAGES/DRESSINGS) ×2 IMPLANT
SUT MNCRL AB 4-0 PS2 18 (SUTURE) ×2 IMPLANT
SUT SURGIDAC NAB ES-9 0 48 120 (SUTURE) IMPLANT
SUT VICRYL 0 TIES 12 18 (SUTURE) ×2 IMPLANT
SYR 20ML LL LF (SYRINGE) ×2 IMPLANT
SYR 50ML LL SCALE MARK (SYRINGE) ×2 IMPLANT
TAPE STRIPS DRAPE STRL (GAUZE/BANDAGES/DRESSINGS) ×2 IMPLANT
TOWEL OR 17X26 10 PK STRL BLUE (TOWEL DISPOSABLE) ×2 IMPLANT
TOWEL OR NON WOVEN STRL DISP B (DISPOSABLE) ×2 IMPLANT
TROCAR BLADELESS 15MM (ENDOMECHANICALS) ×2 IMPLANT
TROCAR BLADELESS OPT 5 100 (ENDOMECHANICALS) ×2 IMPLANT
TUBING CONNECTING 10 (TUBING) ×4 IMPLANT
TUBING ENDO SMARTCAP (MISCELLANEOUS) ×2 IMPLANT

## 2018-09-13 NOTE — Op Note (Signed)
09/13/2018 Lauren Simmons 03/09/1960 [id number]   PRE-OPERATIVE DIAGNOSIS:     Severe obesity BMI 53   Hepatic steatosis/NASH   OSA on CPAP   Essential hypertension   GERD (gastroesophageal reflux disease)   Bilateral plantar fasciitis   Prediabetes  POST-OPERATIVE DIAGNOSIS:  same  PROCEDURE:  Procedure(s): LAPAROSCOPIC SLEEVE GASTRECTOMY  UPPER GI ENDOSCOPY  SURGEON:  Surgeon(s): Gayland Curry, MD FACS FASMBS  ASSISTANTS: Johnathan Hausen MD FACS  ANESTHESIA:   general  DRAINS: none   BOUGIE: 40 fr ViSiGi  LOCAL MEDICATIONS USED:   Exparel  EBL: minimal  SPECIMEN:  Source of Specimen:  Greater curvature of stomach  DISPOSITION OF SPECIMEN:  PATHOLOGY  COUNTS:  YES  INDICATION FOR PROCEDURE: This is a very pleasant 58 y.o.-year-old morbidly obese female who has had unsuccessful attempts for sustained weight loss. The patient presents today for a planned laparoscopic sleeve gastrectomy with upper endoscopy. We have discussed the risk and benefits of the procedure extensively preoperatively. Please see my separate notes.  PROCEDURE: After obtaining informed consent and receiving 5000 units of subcutaneous heparin, the patient was brought to the operating room at [facility] and placed supine on the operating room table. General endotracheal anesthesia was established. Sequential compression devices were placed. A orogastric tube was placed. The patient's abdomen was prepped and draped in the usual standard surgical fashion. The patient received preoperative IV antibiotic. A surgical timeout was performed. ERAS protocol used.   Access to the abdomen was achieved using a 5 mm 0 laparoscope thru a 5 mm trocar In the left upper Quadrant 2 fingerbreadths below the left subcostal margin using the Optiview technique. Pneumoperitoneum was smoothly established up to 15 mm of mercury. The laparoscope was advanced and the abdominal cavity was surveilled. She had a known  pre-existing fat/omental containing umbilical hernia which was left alone. The patient was then placed in reverse Trendelenburg.   A 5 mm trocar was placed slightly above and to the left of the umbilicus under direct visualization.  The liver was nodular and cirrhotic appearing. The [facility] liver retractor was placed under the left lobe of the liver through a 5 mm trocar incision site in the subxiphoid position. A 5 mm trocar was placed in the lateral right upper quadrant along with a 15 mm trocar in the mid right abdomen. A final 5 mm trocar was placed in the lateral LUQ.  All under direct visualization after exparel had been infiltrated in bilateral lateral upper abdominal walls as a TAP block.  The stomach was inspected. It was completely decompressed and the orogastric tube was removed.  There was no small anterior dimple that was obviously visible. Her preop UGI showed no hiatal hernia.  We identified the pylorus and measured 6 cm proximal to the pylorus and identified an area of where we would start taking down the short gastric vessels. Harmonic scalpel was used to take down the short gastric vessels along the greater curvature of the stomach. We were able to enter the lesser sac. We continued to march along the greater curvature of the stomach taking down the short gastrics. As we approached the gastrosplenic ligament we took care in this area not to injure the spleen. We were able to take down the entire gastrosplenic ligament. We then mobilized the fundus away from the left crus of diaphragm.There were not any significant posterior gastric avascular attachments. This left the stomach completely mobilized. No vessels had been taken down along the lesser curvature of  the stomach.  We then reidentified the pylorus. A 40Fr ViSiGi was then placed in the oropharynx and advanced down into the stomach and placed in the distal antrum and positioned along the lesser curvature. It was placed under suction  which secured the 40Fr ViSiGi in place along the lesser curve. Then using the [employer] echelon 60 mm stapler with a black load with Seamguard, I placed a stapler along the antrum approximately 5 cm from the pylorus. The stapler was angled so that there is ample room at the angularis incisura. I then fired the first staple load after inspecting it posteriorly to ensure adequate space both anteriorly and posteriorly. At this point I still was not completely past the angularis so with a green load with Seamguard, I placed the stapler in position just inside the prior stapleline. We then rotated the stomach to insure that there was adequate anteriorly as well as posteriorly. The stapler was then fired.  At this point I started using 60 mm gold load staple cartridge x1 with Seamguard. The echelon stapler was then repositioned with a 60 mm blue load with Seamguard and we continued to march up along the ViSiGi. My assistant was holding traction along the greater curvature stomach along the cauterized short gastric vessels ensuring that the stomach was symmetrically retracted. Prior to each firing of the staple, we rotated the stomach to ensure that there is adequate stomach left.  As we approached the fundus, I used 60 mm blue cartridge with Seamguard aiming  lateral to the GE junction after mobilizing some of the esophageal fat pad.  The sleeve was inspected. There is no evidence of cork screw. The staple line appeared hemostatic. The CRNA inflated the ViSiGi to the green zone and the upper abdomen was flooded with saline. There were no bubbles. The sleeve was decompressed and the ViSiGi removed. My assistant scrubbed out and performed an upper endoscopy. The sleeve easily distended with air and the scope was easily advanced to the pylorus. There is no evidence of internal bleeding or cork screwing. There was no narrowing at the angularis. There is no evidence of bubbles. Please see his operative note for further details.  The gastric sleeve was decompressed and the endoscope was removed.  The greater curvature the stomach was grasped with a laparoscopic grasper and removed from the 15 mm trocar site.  The liver retractor was removed. I then closed the 15 mm trocar site with 1 interrupted 0 Vicryl sutures through the fascia using the endoclose. The closure was viewed laparoscopically and it was airtight. Remaining Exparel was then infiltrated in the preperitoneal spaces around the trocar sites. Pneumoperitoneum was released. All trocar sites were closed with a 4-0 Monocryl in a subcuticular fashion followed by the application of benzoin, steri-strips, and bandaids. The patient was extubated and taken to the recovery room in stable condition. All needle, instrument, and sponge counts were correct x2. There are no immediate complications  (1) 60 mm black with Seamguard (1) 60 mm green with Seamguard (1) 60 mm gold with seamguard (2) 60 mm blue with seamguard  PLAN OF CARE: Admit to inpatient   PATIENT DISPOSITION:  PACU - hemodynamically stable.   Delay start of Pharmacological VTE agent (>24hrs) due to surgical blood loss or risk of bleeding:  no  Mary SellaEric M. Andrey CampanileWilson, MD, FACS FASMBS General, Bariatric, & Minimally Invasive Surgery [facility] [place]

## 2018-09-13 NOTE — Progress Notes (Signed)
Discussed post op day goals with patient including ambulation, IS, diet progression, pain, and nausea control.  BSTOP education provided including BSTOP information guide, "Guide for Pain Management after your Bariatric Procedure".  Questions answered. 

## 2018-09-13 NOTE — Progress Notes (Signed)
PHARMACY CONSULT FOR:  Risk Assessment for Post-Discharge VTE Following Bariatric Surgery  Post-Discharge VTE Risk Assessment: This patient's probability of 30-day post-discharge VTE is increased due to the factors marked:   Female    Age >/=60 years   X BMI >/=50 kg/m2    CHF    Dyspnea at Rest    Paraplegia   X Non-gastric-band surgery    Operation Time >/=3 hr    Return to OR     Length of Stay >/= 3 d      Hx of VTE   Hypercoagulable condition   Significant venous stasis   Predicted probability of 30-day post-discharge VTE:  Other patient-specific factors to consider:   Recommendation for Discharge: 0.27 % -  Patient has a hx of PE/DVT on xarelto 20 mg daily PTA.  Current literature recommends that in patients who require full anticoagulation and have undergone bariatric surgery, it appears most prudent to use warfarin rather than a DOAC due to potential for reduced DOAC absorption and and limited published data on use in this population.  - Per Dr. Redmond Pulling, plan is to treat with LMWH for 2 weeks, then transition patient back to xarelto.    Lauren Simmons is a 58 y.o. female who underwent gastric sleeve gastrectomy  on 09/13/18.   Case start: 0802 Case end: 0916   Allergies  Allergen Reactions  . Sulfamethoxazole-Trimethoprim Shortness Of Breath    Patient Measurements: Height: 5\' 4"  (162.6 cm) Weight: (!) 307 lb 6 oz (139.4 kg) IBW/kg (Calculated) : 54.7 Body mass index is 52.76 kg/m.  Recent Labs    09/13/18 1123  HGB 13.9  HCT 42.7   Estimated Creatinine Clearance: 102.1 mL/min (by C-G formula based on SCr of 0.85 mg/dL).    Past Medical History:  Diagnosis Date  . Apnea   . 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 Complication of anesthesia   . Hyperlipidemia   . Hypertension   . Myocardial infarction ([place])   . PONV (postoperative nausea and vomiting)      Medications Prior to Admission  Medication Sig Dispense Refill Last Dose  .  Cholecalciferol (D 5000) 125 MCG (5000 UT) capsule Take 5,000 Units by mouth daily.   09/12/2018 at Unknown time  . cyclobenzaprine (FLEXERIL) 10 MG tablet Take 10 mg by mouth 3 (three) times daily as needed for muscle spasms.   09/12/2018 at Unknown time  . diclofenac sodium (VOLTAREN) 1 % GEL Apply 2 g topically 4 (four) times daily as needed for pain.   09/12/2018 at Unknown time  . fluticasone (FLONASE) 50 MCG/ACT nasal spray Place 2 sprays into both nostrils daily as needed for allergies.   09/12/2018 at Unknown time  . gabapentin (NEURONTIN) 300 MG capsule Take 300 mg by mouth 2 (two) times a day.   09/13/2018 at 0445  . losartan-hydrochlorothiazide (HYZAAR) 50-12.5 MG tablet Take 1 tablet by mouth daily.   09/12/2018 at Unknown time  . montelukast (SINGULAIR) 10 MG tablet Take 10 mg by mouth daily as needed (allergies).    Past Month at Unknown time  . omeprazole (PRILOSEC) 40 MG capsule Take 40 mg by mouth daily as needed for heartburn.   09/13/2018 at 0445  . potassium chloride (K-DUR) 10 MEQ tablet Take 10 mEq by mouth every other day.   09/12/2018 at Unknown time  . simvastatin (ZOCOR) 10 MG tablet Take 10 mg by mouth at bedtime.   09/12/2018 at Unknown time  .  traZODone (DESYREL) 50 MG tablet Take 150 mg by mouth at bedtime.   09/12/2018 at Unknown time  . XARELTO 20 MG TABS tablet Take 20 mg by mouth daily.    09/10/2018  . butalbital-acetaminophen-caffeine (FIORICET) 50-325-40 MG tablet Take 1 tablet by mouth every 6 (six) hours as needed for migraine.    More than a month at Unknown time  . meclizine (ANTIVERT) 25 MG tablet Take 25 mg by mouth 3 (three) times daily as needed for dizziness.   More than a month at Unknown time       [name], Andrick Rust P 09/13/2018,1:16 PM

## 2018-09-13 NOTE — Transfer of Care (Signed)
Immediate Anesthesia Transfer of Care Note  Patient: Lauren Simmons  Procedure(s) Performed: LAPAROSCOPIC GASTRIC SLEEVE RESECTION, UPPER ENDO, ERAS Pathway (N/A )  Patient Location: PACU  Anesthesia Type:General  Level of Consciousness: awake and alert   Airway & Oxygen Therapy: Patient Spontanous Breathing and Patient connected to face mask oxygen  Post-op Assessment: Report given to RN and Post -op Vital signs reviewed and stable  Post vital signs: Reviewed and stable  Last Vitals:  Vitals Value Taken Time  BP 126/55 09/13/18 0930  Temp    Pulse 70 09/13/18 0931  Resp 24 09/13/18 0931  SpO2 100 % 09/13/18 0931  Vitals shown include unvalidated device data.  Last Pain:  Vitals:   09/13/18 0618  TempSrc:   PainSc: 5       Patients Stated Pain Goal: 3 (41/28/78 6767)  Complications: No apparent anesthesia complications

## 2018-09-13 NOTE — Interval H&P Note (Signed)
History and Physical Interval Note:  09/13/2018 7:07 AM  Lauren Simmons  has presented today for surgery, with the diagnosis of Morbid Obesity, HTN, OSA, NASH, Osteoarthritis, Pre Diabetes.  The various methods of treatment have been discussed with the patient and family. After consideration of risks, benefits and other options for treatment, the patient has consented to  Procedure(s): LAPAROSCOPIC GASTRIC SLEEVE RESECTION, UPPER ENDO, ERAS Pathway (N/A) as a surgical intervention.  The patient's history has been reviewed, patient examined, no change in status, stable for surgery.  I have reviewed the patient's chart and labs.  Questions were answered to the patient's satisfaction.    Leighton Ruff. Redmond Pulling, MD, FACS General, Bariatric, & Minimally Invasive Surgery [facility] [place]   Greer Pickerel

## 2018-09-13 NOTE — Anesthesia Procedure Notes (Signed)
Date/Time: 09/13/2018 9:21 AM Performed by: Cynda Familia, CRNA Oxygen Delivery Method: Simple face mask

## 2018-09-13 NOTE — Op Note (Signed)
Lauren Simmons [id number] November 26, 1960 09/13/2018  Preoperative diagnosis: morbid obesity with NASH  Postoperative diagnosis: Same   Procedure: Upper endoscopy   Surgeon: Catalina Antigua B. Hassell Done  M.D., FACS   Anesthesia: Gen.   Indications for procedure: This patient was undergoing a sleeve gastrectomy by Dr. Redmond Pulling.    Description of procedure: The endoscopy was placed in the mouth and into the oropharynx and under endoscopic vision it was advanced to the esophagogastric junction.  The pouch was insufflated and the GE junction noted and we could visualize a nice, cylindrical sleeve.  The scope was passed into the antrum.   No bleeding or leaks were detected.  The scope was withdrawn without difficulty.     Matt B. Hassell Done, MD, FACS General, Bariatric, & Minimally Invasive Surgery [facility], [place]

## 2018-09-13 NOTE — Anesthesia Postprocedure Evaluation (Signed)
Anesthesia Post Note  Patient: Lauren Simmons  Procedure(s) Performed: LAPAROSCOPIC GASTRIC SLEEVE RESECTION, UPPER ENDO, ERAS Pathway (N/A )     Patient location during evaluation: PACU Anesthesia Type: General Level of consciousness: awake and alert Pain management: pain level controlled Vital Signs Assessment: post-procedure vital signs reviewed and stable Respiratory status: spontaneous breathing, nonlabored ventilation, respiratory function stable and patient connected to nasal cannula oxygen Cardiovascular status: blood pressure returned to baseline and stable Postop Assessment: no apparent nausea or vomiting Anesthetic complications: no    Last Vitals:  Vitals:   09/13/18 1045 09/13/18 1100  BP: 124/69 126/71  Pulse: 66 62  Resp: 18 19  Temp:    SpO2: 94% 95%    Last Pain:  Vitals:   09/13/18 1100  TempSrc:   PainSc: Asleep                 Shantara Goosby,W. EDMOND

## 2018-09-13 NOTE — Anesthesia Procedure Notes (Signed)
Procedure Name: Intubation Date/Time: 09/13/2018 7:28 AM Performed by: Cynda Familia, CRNA Pre-anesthesia Checklist: Patient identified, Emergency Drugs available, Suction available and Patient being monitored Patient Re-evaluated:Patient Re-evaluated prior to induction Oxygen Delivery Method: Circle System Utilized Preoxygenation: Pre-oxygenation with 100% oxygen Induction Type: IV induction, Rapid sequence and Cricoid Pressure applied Ventilation: Mask ventilation without difficulty Tube type: Oral Tube size: 7.0 mm Number of attempts: 1 Airway Equipment and Method: Stylet,  Oral airway and Video-laryngoscopy Placement Confirmation: ETT inserted through vocal cords under direct vision,  positive ETCO2 and breath sounds checked- equal and bilateral Secured at: 22 cm Tube secured with: Tape Dental Injury: Teeth and Oropharynx as per pre-operative assessment  Comments: Smooth IV induction Fitzgerald-- intubation AM CRNA with Glidescope due to no neck , limited mouth opening-- atraumatic-- teeth and mouth as preop-- bilat BS

## 2018-09-14 ENCOUNTER — Encounter (HOSPITAL_COMMUNITY): Payer: Self-pay | Admitting: General Surgery

## 2018-09-14 LAB — COMPREHENSIVE METABOLIC PANEL
ALT: 78 U/L — ABNORMAL HIGH (ref 0–44)
AST: 58 U/L — ABNORMAL HIGH (ref 15–41)
Albumin: 3.1 g/dL — ABNORMAL LOW (ref 3.5–5.0)
Alkaline Phosphatase: 89 U/L (ref 38–126)
Anion gap: 8 (ref 5–15)
BUN: 9 mg/dL (ref 6–20)
CO2: 21 mmol/L — ABNORMAL LOW (ref 22–32)
Calcium: 8.5 mg/dL — ABNORMAL LOW (ref 8.9–10.3)
Chloride: 104 mmol/L (ref 98–111)
Creatinine, Ser: 0.66 mg/dL (ref 0.44–1.00)
GFR calc Af Amer: >60 mL/min (ref >60)
GFR calc non Af Amer: >60 mL/min (ref >60)
Glucose, Bld: 165 mg/dL — ABNORMAL HIGH (ref 70–99)
Potassium: 4.3 mmol/L (ref 3.5–5.1)
Sodium: 133 mmol/L — ABNORMAL LOW (ref 135–145)
Total Bilirubin: 1.2 mg/dL (ref 0.3–1.2)
Total Protein: 6.7 g/dL (ref 6.5–8.1)

## 2018-09-14 LAB — CBC WITH DIFFERENTIAL/PLATELET
Abs Immature Granulocytes: 0.02 10*3/uL (ref 0.00–0.07)
Basophils Absolute: 0 10*3/uL (ref 0.0–0.1)
Basophils Relative: 0 %
Eosinophils Absolute: 0 10*3/uL (ref 0.0–0.5)
Eosinophils Relative: 0 %
HCT: 38.4 % (ref 36.0–46.0)
Hemoglobin: 12.4 g/dL (ref 12.0–15.0)
Immature Granulocytes: 0 %
Lymphocytes Relative: 16 %
Lymphs Abs: 1 10*3/uL (ref 0.7–4.0)
MCH: 30.1 pg (ref 26.0–34.0)
MCHC: 32.3 g/dL (ref 30.0–36.0)
MCV: 93.2 fL (ref 80.0–100.0)
Monocytes Absolute: 0.4 10*3/uL (ref 0.1–1.0)
Monocytes Relative: 6 %
Neutro Abs: 4.5 10*3/uL (ref 1.7–7.7)
Neutrophils Relative %: 78 %
Platelets: 106 10*3/uL — ABNORMAL LOW (ref 150–400)
RBC: 4.12 MIL/uL (ref 3.87–5.11)
RDW: 13.5 % (ref 11.5–15.5)
WBC: 5.8 10*3/uL (ref 4.0–10.5)
nRBC: 0 % (ref 0.0–0.2)

## 2018-09-14 LAB — HEMOGLOBIN A1C
Hgb A1c MFr Bld: 5.4 % (ref 4.8–5.6)
Mean Plasma Glucose: 108.28 mg/dL

## 2018-09-14 LAB — GLUCOSE, CAPILLARY
Glucose-Capillary: 147 mg/dL — ABNORMAL HIGH (ref 70–99)
Glucose-Capillary: 156 mg/dL — ABNORMAL HIGH (ref 70–99)
Glucose-Capillary: 89 mg/dL (ref 70–99)

## 2018-09-14 MED ORDER — ENOXAPARIN SODIUM 40 MG/0.4ML ~~LOC~~ SOLN: 40.0000 mg | SUBCUTANEOUS | 0 refills | Status: DC

## 2018-09-14 MED ORDER — ENOXAPARIN SODIUM 40 MG/0.4ML ~~LOC~~ SOLN: 40.0000 mg | Freq: Two times a day (BID) | SUBCUTANEOUS | 0 refills | Status: AC

## 2018-09-14 MED ORDER — OXYCODONE HCL 5 MG PO TABS: 5.0000 mg | ORAL_TABLET | Freq: Four times a day (QID) | ORAL | 0 refills | Status: AC | PRN

## 2018-09-14 MED ORDER — ONDANSETRON 4 MG PO TBDP: 4.0000 mg | ORAL_TABLET | Freq: Four times a day (QID) | ORAL | 0 refills | Status: AC | PRN

## 2018-09-14 MED ORDER — PNEUMOCOCCAL VAC POLYVALENT 25 MCG/0.5ML IJ INJ
0.5000 mL | INJECTION | INTRAMUSCULAR | Status: AC | PRN
  Administered 2018-09-14: 0.5 mL via INTRAMUSCULAR
  Filled 2018-09-14: qty 0.5

## 2018-09-14 MED ORDER — ACETAMINOPHEN 500 MG PO TABS: 1000.0000 mg | ORAL_TABLET | Freq: Three times a day (TID) | ORAL | 0 refills | Status: AC

## 2018-09-14 MED ORDER — XARELTO 20 MG PO TABS: 20.0000 mg | ORAL_TABLET | Freq: Every day | ORAL | Status: AC

## 2018-09-14 MED ORDER — PNEUMOCOCCAL VAC POLYVALENT 25 MCG/0.5ML IJ INJ: 0.5000 mL | INJECTION | INTRAMUSCULAR | Status: DC

## 2018-09-14 NOTE — Progress Notes (Signed)
Patient alert and oriented, Post op day 1.  Provided support and encouragement.  Encouraged pulmonary toilet, ambulation and small sips of liquids.  All questions answered.  Will continue to monitor. 

## 2018-09-14 NOTE — Discharge Instructions (Signed)
RESUME ORAL BLOOD THINNER/XARELTO IN 2 WEEKS      GASTRIC BYPASS/SLEEVE  Home Care Instructions   These instructions are to help you care for yourself when you go home.  Call: If you have any problems.  Call [phone] and ask for the surgeon on call  If you need immediate help, come to the ER at [facility].   Tell the ER staff that you are a new post-op gastric bypass or gastric sleeve patient   Signs and symptoms to report:  Severe vomiting or nausea o If you cannot keep down clear liquids for longer than 1 day, call your surgeon   Abdominal pain that does not get better after taking your pain medication  Fever over 100.4 F with chills  Heart beating over 100 beats a minute  Shortness of breath at rest  Chest pain   Redness, swelling, drainage, or foul odor at incision (surgical) sites   If your incisions open or pull apart  Swelling or pain in calf (lower leg)  Diarrhea (Loose bowel movements that happen often), frequent watery, uncontrolled bowel movements  Constipation, (no bowel movements for 3 days) if this happens: Pick one o Milk of Magnesia, 2 tablespoons by mouth, 3 times a day for 2 days if needed o Stop taking Milk of Magnesia once you have a bowel movement o Call your doctor if constipation continues Or o Miralax  (instead of Milk of Magnesia) following the label instructions o Stop taking Miralax once you have a bowel movement o Call your doctor if constipation continues  Anything you think is not normal   Normal side effects after surgery:  Unable to sleep at night or unable to focus  Irritability or moody  Being tearful (crying) or depressed These are common complaints, possibly related to your anesthesia medications that put you to sleep, stress of surgery, and change in lifestyle.  This usually goes away a few weeks after surgery.  If these feelings continue, call your primary care doctor.   Wound Care: You may have surgical glue,  steri-strips, or staples over your incisions after surgery  Surgical glue:  Looks like a clear film over your incisions and will wear off a little at a time  Steri-strips: Strips of tape over your incisions. You may notice a yellowish color on the skin under the steri-strips. This is used to make the   steri-strips stick better. Do not pull the steri-strips off - let them fall off  Staples: Staples may be removed before you leave the hospital o If you go home with staples, call [facility], [phone]) [phone] at for an appointment with your surgeons nurse to have staples removed 10 days after surgery.  Showering: You may shower two (2) days after your surgery unless your surgeon tells you differently o Wash gently around incisions with warm soapy water, rinse well, and gently pat dry  o No tub baths until staples are removed, steri-strips fall off or glue is gone.    Medications:  Medications should be liquid or crushed if larger than the size of a dime  Extended release pills (medication that release a little bit at a time through the day) should NOT be crushed or cut. (examples include XL, ER, DR, SR)  Depending on the size and number of medications you take, you may need to space (take a few throughout the day)/change the time you take your medications so that you do not over-fill your pouch (smaller stomach)  Make  sure you follow-up with your primary care doctor to make medication changes needed during rapid weight loss and life-style changes  If you have diabetes, follow up with the doctor that orders your diabetes medication(s) within one week after surgery and check your blood sugar regularly.  Do not drive while taking prescription pain medication   It is ok to take Tylenol by the bottle instructions with your pain medicine or instead of your pain medicine as needed.  DO NOT TAKE NSAIDS (EXAMPLES OF NSAIDS:  IBUPROFREN/ NAPROXEN)  Diet:                    First 2 Weeks   You will see the dietician t about two (2) weeks after your surgery. The dietician will increase the types of foods you can eat if you are handling liquids well:  If you have severe vomiting or nausea and cannot keep down clear liquids lasting longer than 1 day, call your surgeon @ [phone]) Protein Shake  Drink at least 2 ounces of shake 5-6 times per day  Each serving of protein shakes (usually 8 - 12 ounces) should have: o 15 grams of protein  o And no more than 5 grams of carbohydrate   Goal for protein each day: o Men = 80 grams per day o Women = 60 grams per day  Protein powder may be added to fluids such as non-fat milk or Lactaid milk or unsweetened Soy/Almond milk (limit to 35 grams added protein powder per serving)  Hydration  Slowly increase the amount of water and other clear liquids as tolerated (See Acceptable Fluids)  Slowly increase the amount of protein shake as tolerated    Sip fluids slowly and throughout the day.  Do not use straws.  May use sugar substitutes in small amounts (no more than 6 - 8 packets per day; i.e. Splenda)  Fluid Goal  The first goal is to drink at least 8 ounces of protein shake/drink per day (or as directed by the nutritionist); some examples of protein shakes are [employer]x Nectar, Dillard'sdkins Advantage, EAS Edge HP, and Unjury. See handout from pre-op Bariatric Education Class: o Slowly increase the amount of protein shake you drink as tolerated o You may find it easier to slowly sip shakes throughout the day o It is important to get your proteins in first  Your fluid goal is to drink 64 - 100 ounces of fluid daily o It may take a few weeks to build up to this  32 oz (or more) should be clear liquids  And   32 oz (or more) should be full liquids (see below for examples)  Liquids should not contain sugar, caffeine, or carbonation  Clear Liquids:  Water or Sugar-free flavored water (i.e. Fruit H2O, Propel)  Decaffeinated coffee or  tea (sugar-free)  Crystal Lite, Wylers Lite, Minute Maid Lite  Sugar-free Jell-O  Bouillon or broth  Sugar-free Popsicle:   *Less than 20 calories each; Limit 1 per day  Full Liquids: Protein Shakes/Drinks + 2 choices per day of other full liquids  Full liquids must be: o No More Than 15 grams of Carbs per serving  o No More Than 3 grams of Fat per serving  Strained low-fat cream soup (except Cream of Potato or Tomato)  Non-Fat milk  Fat-free Lactaid Milk  Unsweetened Soy Or Unsweetened Almond Milk  Low Sugar yogurt (Dannon Lite & Fit, Greek yogurt; Oikos Triple Zero; Chobani Simply 100; Yoplait 100 calorie AustriaGreek -  No Fruit on the Bottom)    Vitamins and Minerals  Start 1 day after surgery unless otherwise directed by your surgeon  2 Chewable Bariatric Specific Multivitamin / Multimineral Supplement with iron (Example: Bariatric Advantage Multi EA)  Chewable Calcium with Vitamin D-3 (Example: 3 Chewable Calcium Plus 600 with Vitamin D-3) o Take 500 mg three (3) times a day for a total of 1500 mg each day o Do not take all 3 doses of calcium at one time as it may cause constipation, and you can only absorb 500 mg  at a time  o Do not mix multivitamins containing iron with calcium supplements; take 2 hours apart  Menstruating women and those with a history of anemia (a blood disease that causes weakness) may need extra iron o Talk with your doctor to see if you need more iron  Do not stop taking or change any vitamins or minerals until you talk to your dietitian or surgeon  Your Dietitian and/or surgeon must approve all vitamin and mineral supplements   Activity and Exercise: Limit your physical activity as instructed by your doctor.  It is important to continue walking at home.  During this time, use these guidelines:  Do not lift anything greater than ten (10) pounds for at least two (2) weeks  Do not go back to work or drive until Designer, industrial/productyour surgeon says you can  You  may have sex when you feel comfortable  o It is VERY important for female patients to use a reliable birth control method; fertility often increases after surgery  o All hormonal birth control will be ineffective for 30 days after surgery due to medications given during surgery a barrier method must be used. o Do not get pregnant for at least 18 months  Start exercising as soon as your doctor tells you that you can o Make sure your doctor approves any physical activity  Start with a simple walking program  Walk 5-15 minutes each day, 7 days per week.   Slowly increase until you are walking 30-45 minutes per day Consider joining our BELT program. [phone] or email [email or web address]@[employer] .edu   Special Instructions Things to remember:  Use your CPAP when sleeping if this applies to you   [facility] has two free Bariatric Surgery Support Groups that meet monthly o The 3rd Thursday of each month, 6 pm, [facility]  o The 2nd Friday of each month, 11:45 am in the private dining room in the basement of [facility]  It is very important to keep all follow up appointments with your surgeon, dietitian, primary care physician, and behavioral health practitioner  Routine follow up schedule with your surgeon include appointments at 2-3 weeks, 6-8 weeks, 6 months, and 1 year at a minimum.  Your surgeon may request to see you more often.   o After the first year, please follow up with your bariatric surgeon and dietitian at least once a year in order to maintain best weight loss results [facility]: [phone] [facility]: [phone] Bariatric Nurse Coordinator: [phone]      Reviewed and Endorsed  by [facility] Patient Education Committee, June, 2016 Edits Approved: Aug, 2018

## 2018-09-14 NOTE — Progress Notes (Signed)
Lovenox education reviewed with patient,  Lovenox teach kit provided and at bedside.  Patient able to articulate proper technique, s/s to report, and Lovenox schedule.  Questions answered.

## 2018-09-14 NOTE — Progress Notes (Signed)
D/C instructions given to patient. Patient has no questions. Once patient is dressed Probation officer and NT will take the patient outside to the front of the building

## 2018-09-14 NOTE — Discharge Summary (Addendum)
Physician Discharge Summary  ATIANA LEVIER UQJ:[id number] DOB: [birth year] DOA: 09/13/2018  PCP: Blythe Stanford., MD  Admit date: 09/13/2018 Discharge date: 09/14/2018  Recommendations for Outpatient Follow-up:   Follow-up Information    Greer Pickerel, MD. Go on 09/23/2018.   Specialty: General Surgery Why: Please arrive 15 minute early for your 11 am appointment Contact information: [address] [place] [address] [phone]        Carlena Hurl, PA-C. Go on 10/25/2018.   Specialty: General Surgery Contact information: [place] [address] [phone]          Discharge Diagnoses:  Principal Problem:   Severe obesity ([place]) Active Problems:   Hepatic steatosis   OSA on CPAP   Essential hypertension   GERD (gastroesophageal reflux disease)   Bilateral plantar fasciitis   Prediabetes   S/P laparoscopic sleeve gastrectomy   Surgical Procedure: Laparoscopic Sleeve Gastrectomy, upper endoscopy  Discharge Condition: Good Disposition: Home  Diet recommendation: Postoperative sleeve gastrectomy diet (liquids only)  Filed Weights   09/13/18 0556 09/13/18 0604  Weight: (!) 140.6 kg (!) 139.4 kg     Hospital Course:  The patient was admitted for a planned laparoscopic sleeve gastrectomy. Please see operative note. Preoperatively the patient was given 5000 units of subcutaneous heparin for DVT prophylaxis. Postoperative prophylactic Lovenox dosing was started on the evening of postoperative day 0. ERAS protocol was used. On the evening of postoperative day 0, the patient was started on water and ice chips. On postoperative day 1 the patient had no fever or tachycardia and was tolerating water in their diet was gradually advanced throughout the day. The patient was ambulating without difficulty. Their vital signs are stable without fever or tachycardia. Their hemoglobin had remained stable. The patient was maintained on their home  settings for CPAP therapy. The patient had received discharge instructions and counseling. They were deemed stable for discharge and had met discharge criteria   Discharge Instructions  Discharge Instructions    Ambulate hourly while awake   Complete by: As directed    Call MD for:  difficulty breathing, headache or visual disturbances   Complete by: As directed    Call MD for:  persistant dizziness or light-headedness   Complete by: As directed    Call MD for:  persistant nausea and vomiting   Complete by: As directed    Call MD for:  redness, tenderness, or signs of infection (pain, swelling, redness, odor or green/yellow discharge around incision site)   Complete by: As directed    Call MD for:  severe uncontrolled pain   Complete by: As directed    Call MD for:  temperature >101 F   Complete by: As directed    Diet bariatric full liquid   Complete by: As directed    Discharge instructions   Complete by: As directed    See bariatric discharge instructions Resume xarelto in 2 weeks   Incentive spirometry   Complete by: As directed    Perform hourly while awake     Allergies as of 09/14/2018      Reactions   Sulfamethoxazole-trimethoprim Shortness Of Breath      Medication List    TAKE these medications   acetaminophen 500 MG tablet Commonly known as: TYLENOL Take 2 tablets (1,000 mg total) by mouth every 8 (eight) hours for 5 days.   butalbital-acetaminophen-caffeine 50-325-40 MG tablet Commonly known as: FIORICET Take 1 tablet by mouth every 6 (  six) hours as needed for migraine.   cyclobenzaprine 10 MG tablet Commonly known as: FLEXERIL Take 10 mg by mouth 3 (three) times daily as needed for muscle spasms.   D 5000 125 MCG (5000 UT) capsule Generic drug: Cholecalciferol Take 5,000 Units by mouth daily.   diclofenac sodium 1 % Gel Commonly known as: VOLTAREN Apply 2 g topically 4 (four) times daily as needed for pain.   enoxaparin 40 MG/0.4ML  injection Commonly known as: LOVENOX Inject 0.4 mLs (40 mg total) into the skin every 12 (twelve) hours for 14 days.   fluticasone 50 MCG/ACT nasal spray Commonly known as: FLONASE Place 2 sprays into both nostrils daily as needed for allergies.   gabapentin 300 MG capsule Commonly known as: NEURONTIN Take 300 mg by mouth 2 (two) times a day.   losartan-hydrochlorothiazide 50-12.5 MG tablet Commonly known as: HYZAAR Take 1 tablet by mouth daily. Notes to patient: Monitor Blood Pressure Daily and keep a log for primary care physician.  You may need to make changes to your medications with rapid weight loss.     meclizine 25 MG tablet Commonly known as: ANTIVERT Take 25 mg by mouth 3 (three) times daily as needed for dizziness.   montelukast 10 MG tablet Commonly known as: SINGULAIR Take 10 mg by mouth daily as needed (allergies).   omeprazole 40 MG capsule Commonly known as: PRILOSEC Take 40 mg by mouth daily as needed for heartburn.   ondansetron 4 MG disintegrating tablet Commonly known as: ZOFRAN-ODT Take 1 tablet (4 mg total) by mouth every 6 (six) hours as needed for nausea or vomiting.   oxyCODONE 5 MG immediate release tablet Commonly known as: Oxy IR/ROXICODONE Take 1 tablet (5 mg total) by mouth every 6 (six) hours as needed for severe pain.   potassium chloride 10 MEQ tablet Commonly known as: K-DUR Take 10 mEq by mouth every other day.   simvastatin 10 MG tablet Commonly known as: ZOCOR Take 10 mg by mouth at bedtime.   traZODone 50 MG tablet Commonly known as: DESYREL Take 150 mg by mouth at bedtime.   Xarelto 20 MG Tabs tablet Generic drug: rivaroxaban Take 1 tablet (20 mg total) by mouth daily. Start taking on: September 28, 2018 What changed: These instructions start on September 28, 2018. If you are unsure what to do until then, ask your doctor or other care provider.      Follow-up Information    Greer Pickerel, MD. Go on 09/23/2018.   Specialty:  General Surgery Why: Please arrive 15 minute early for your 11 am appointment Contact information: [address] [place] [address] [phone]        Carlena Hurl, PA-C. Go on 10/25/2018.   Specialty: General Surgery Contact information: [place] [address] [phone]            The results of significant diagnostics from this hospitalization (including imaging, microbiology, ancillary and laboratory) are listed below for reference.    Significant Diagnostic Studies: No results found.  Labs: Basic Metabolic Panel: Recent Labs  Lab 09/09/18 0944 09/14/18 0340  NA 138 133*  K 4.2 4.3  CL 105 104  CO2 24 21*  GLUCOSE 108* 165*  BUN 15 9  CREATININE 0.85 0.66  CALCIUM 9.4 8.5*   Liver Function Tests: Recent Labs  Lab 09/09/18 0944 09/14/18 0340  AST 137* 58*  ALT 124* 78*  ALKPHOS 109 89  BILITOT 1.6* 1.2  PROT 8.0 6.7  ALBUMIN 3.8 3.1*    CBC: Recent Labs  Lab 09/09/18 0944 09/13/18 1123 09/14/18 0340  WBC 3.8*  --  5.8  NEUTROABS 1.7  --  4.5  HGB 14.8 13.9 12.4  HCT 45.2 42.7 38.4  MCV 93.2  --  93.2  PLT 146*  --  106*    CBG: Recent Labs  Lab 09/13/18 2024 09/13/18 2346 09/14/18 0343 09/14/18 0719 09/14/18 1205  GLUCAP 175* 155* 147* 156* 89    Principal Problem:   Severe obesity ([place]) Active Problems:   Hepatic steatosis   OSA on CPAP   Essential hypertension   GERD (gastroesophageal reflux disease)   Bilateral plantar fasciitis   Prediabetes   S/P laparoscopic sleeve gastrectomy   Time coordinating discharge: 15 min  Signed:  Gayland Curry, MD [facility], [place] 09/14/2018, 1:50 PM

## 2018-09-14 NOTE — Progress Notes (Signed)
Patient alert and oriented, pain is controlled. Patient is tolerating fluids, advanced to protein shake today, patient is tolerating well.  Reviewed Gastric sleeve discharge instructions with patient and patient is able to articulate understanding.  Provided information on BELT program, Support Group and [facility] outpatient pharmacy. All questions answered, will continue to monitor.   Total fluid intake 1680 Per dehydration protocol call back one week post op

## 2018-09-14 NOTE — Progress Notes (Signed)
Patient ID: Lauren PastorBelinda D Goertzen, female   DOB: 1960/05/11, [age] y.o.   MRN: [id number]   Progress Note: Metabolic and Bariatric Surgery Service   Chief Complaint/Subjective: C/o migraine/HA Did well with water On protein  C/o lower/mid abd pain/discomfort, not related to drinking Walked yesterday  Objective: Vital signs in last 24 hours: Temp:  [97.4 F (36.3 C)-98.7 F (37.1 C)] 97.8 F (36.6 C) (08/05 0558) Pulse Rate:  [59-101] 101 (08/05 0558) Resp:  [17-22] 18 (08/05 0558) BP: (98-148)/(55-85) 135/59 (08/05 0558) SpO2:  [94 %-100 %] 99 % (08/05 0558) Last BM Date: 09/12/18  Intake/Output from previous day: 08/04 0701 - 08/05 0700 In: 5096.3 [P.O.:1137; I.V.:3959.3] Out: 2320 [Urine:2300; Blood:20] Intake/Output this shift: Total I/O In: 60 [P.O.:60] Out: -   Lungs: cta  Cardiovascular: reg  Abd: soft, mild exp TTP, incisions ok  Extremities: no edema, +SCD  Neuro: ox3, nontoxic  Lab Results: CBC  Recent Labs    09/13/18 1123 09/14/18 0340  WBC  --  5.8  HGB 13.9 12.4  HCT 42.7 38.4  PLT  --  106*   BMET Recent Labs    09/14/18 0340  NA 133*  K 4.3  CL 104  CO2 21*  GLUCOSE 165*  BUN 9  CREATININE 0.66  CALCIUM 8.5*   PT/INR Recent Labs    09/13/18 0625  LABPROT 13.4  INR 1.0   ABG No results for input(s): PHART, HCO3 in the last 72 hours.  Invalid input(s): PCO2, PO2  Studies/Results:  Anti-infectives: Anti-infectives (From admission, onward)   Start     Dose/Rate Route Frequency Ordered Stop   09/13/18 0600  cefoTEtan (CEFOTAN) 2 g in sodium chloride 0.9 % 100 mL IVPB     2 g 200 mL/hr over 30 Minutes Intravenous On call to O.R. 09/13/18 0555 09/13/18 0740      Medications: Scheduled Meds: . acetaminophen  1,000 mg Oral Q8H   Or  . acetaminophen (TYLENOL) oral liquid 160 mg/5 mL  1,000 mg Oral Q8H  . enoxaparin (LOVENOX) injection  30 mg Subcutaneous Q12H  . gabapentin  300 mg Oral BID  . losartan  50 mg Oral Daily   And  . hydrochlorothiazide  12.5 mg Oral Daily  . insulin aspart  0-15 Units Subcutaneous Q4H  . pantoprazole (PROTONIX) IV  40 mg Intravenous QHS  . Ensure Max Protein  2 oz Oral Q2H  . traZODone  150 mg Oral QHS   Continuous Infusions: . dextrose 5 % and 0.45 % NaCl with KCl 20 mEq/L 125 mL/hr at 09/14/18 0527   PRN Meds:.cyclobenzaprine, diphenhydrAMINE, fluticasone, ketorolac, meclizine, morphine injection, ondansetron (ZOFRAN) IV, oxyCODONE, promethazine, simethicone  Assessment/Plan: Patient Active Problem List   Diagnosis Date Noted  . Severe obesity ([facility]) 09/13/2018  . Hepatic steatosis 09/13/2018  . OSA on CPAP 09/13/2018  . Essential hypertension 09/13/2018  . GERD (gastroesophageal reflux disease) 09/13/2018  . Bilateral plantar fasciitis 09/13/2018  . Prediabetes 09/13/2018  . S/P laparoscopic sleeve gastrectomy 09/13/2018   s/p Procedure(s): LAPAROSCOPIC GASTRIC SLEEVE RESECTION, UPPER ENDO, ERAS Pathway 09/13/2018  Principal Problem:   Severe obesity ([facility]) Active Problems:   Hepatic steatosis   OSA on CPAP   Essential hypertension   GERD (gastroesophageal reflux disease)   Bilateral plantar fasciitis   Prediabetes   S/P laparoscopic sleeve gastrectomy  Doing well Lower abd pain prob gas pain - vitals, labs, and exam reassuring Will get 2 weeks of lovenox before transitioning back to xarelto for h/o pe toradol  for headache  Disposition:  LOS: 1 day  The patient should be discharged from the hospital today  Greer Pickerel, MD [phone] [facility]

## 2018-09-15 LAB — TYPE AND SCREEN
ABO/RH(D): A POS
Antibody Screen: NEGATIVE
Unit division: 0
Unit division: 0

## 2018-09-15 LAB — BPAM RBC
Blood Product Expiration Date: 202008242359
Blood Product Expiration Date: 202008242359
Unit Type and Rh: 6200
Unit Type and Rh: 6200

## 2018-09-19 ENCOUNTER — Telehealth (HOSPITAL_COMMUNITY): Payer: Self-pay

## 2018-09-19 NOTE — Telephone Encounter (Signed)
Patient called to discuss post bariatric surgery follow up questions.  See below:   1.  Tell me about your pain and pain management?denies  2.  Let's talk about fluid intake.  How much total fluid are you taking in?90 ounces  3.  How much protein have you taken in the last 2 days?45 grams  4.  Have you had nausea?  Tell me about when have experienced nausea and what you did to help?denies  5.  Has the frequency or color changed with your urine?urinating frequently light color  6.  Tell me what your incisions look like?no problems  7.  Have you been passing gas? BM?pasing gas bm yesterday  8.  If a problem or question were to arise who would you call?  Do you know contact numbers for [place], [facility], and [facility]?denies  9.  How has the walking going?walking regularly  10.  How are your vitamins and calcium going?  How are you taking them?taking mvi and calcium no problem

## 2018-09-27 ENCOUNTER — Encounter: Payer: BC Managed Care – PPO | Attending: General Surgery | Admitting: Skilled Nursing Facility1

## 2018-09-27 ENCOUNTER — Other Ambulatory Visit: Payer: Self-pay

## 2018-09-27 DIAGNOSIS — E669 Obesity, unspecified: Secondary | ICD-10-CM | POA: Insufficient documentation

## 2018-09-27 NOTE — Progress Notes (Signed)
2 Week Post-Operative Nutrition Class   Patient was seen on 04/05/18 for Post-Operative Nutrition education at the [facility].    Surgery date: 09/13/18 Surgery type: sleeve Start weight at [facility]: 299.7 Weight today: 299.3   Body Composition Scale 09/27/2018  Total Body Fat % 50.3  Visceral Fat 24  Fat-Free Mass % 49.6   Total Body Water % 39.3   Muscle-Mass lbs 30.6  Body Fat Displacement          Torso  lbs 93.6         Left Leg  lbs 18.7         Right Leg  lbs 18.7         Left Arm  lbs 9.3         Right Arm   lbs 9.3     The following the learning objectives were met by the patient during this course:  Identifies Phase 3 (Soft, High Proteins) Dietary Goals and will begin from 2 weeks post-operatively to 2 months post-operatively  Identifies appropriate sources of fluids and proteins   States protein recommendations and appropriate sources post-operatively  Identifies the need for appropriate texture modifications, mastication, and bite sizes when consuming solids  Identifies appropriate multivitamin and calcium sources post-operatively  Describes the need for physical activity post-operatively and will follow MD recommendations  States when to call healthcare provider regarding medication questions or post-operative complications   Handouts given during class include:  Phase 3A: Soft, High Protein Diet Handout   Follow-Up Plan: Patient will follow-up at [facility] in 6 weeks for 2 month post-op nutrition visit for diet advancement per MD.

## 2018-10-03 ENCOUNTER — Telehealth: Payer: Self-pay | Admitting: Dietician

## 2018-10-03 NOTE — Telephone Encounter (Signed)
I called patient via telephone to assess fluid intake and food tolerance since diet advancement to solid protein foods on 09/27/2018.  Patient unavailable, LVM.     Lauren Nicole (Nikki) Short, MS, RD, LDN   

## 2018-11-08 ENCOUNTER — Encounter: Payer: BC Managed Care – PPO | Attending: General Surgery | Admitting: Dietician

## 2018-11-08 ENCOUNTER — Other Ambulatory Visit: Payer: Self-pay

## 2018-11-08 ENCOUNTER — Encounter: Payer: Self-pay | Admitting: Dietician

## 2018-11-08 DIAGNOSIS — E669 Obesity, unspecified: Secondary | ICD-10-CM | POA: Insufficient documentation

## 2018-11-08 NOTE — Progress Notes (Signed)
Bariatric Follow-Up Visit Medical Nutrition Therapy  Appt Start Time: 9:05am  End Time: 9:50am  2 Months Post-Operative Sleeve Gastrectomy Surgery Surgery Date: 09/13/2018  Pt's Expectations of Surgery/ Goals: to lose weight and get off of some medications  States she can walk farther at a time and more easily move around   NUTRITION ASSESSMENT  Anthropometrics  Start weight at [facility]: 299.7 lbs (05/17/2017) Today's weight: 282.6 lbs Total weight lost: -17.1 lbs  Body Composition Scale 09/27/2018 11/08/2018  Weight  lbs 299.3 282.6  BMI 51.4 48.5  Total Body Fat  % 50.3 49.6     Visceral Fat 24 23  Fat-Free Mass  % 49.6 50.3     Total Body Water  % 39.3 39.6     Muscle-Mass  lbs 30.6 29.9  Body Fat Displacement ---          Torso  lbs 93.6 87.1         Left Leg  lbs 18.7 17.4         Right Leg  lbs 18.7 17.4         Left Arm  lbs 9.3 8.7         Right Arm  lbs 9.3 8.7    24-Hr Dietary Recall First Meal: 1-2 boiled eggs (or scrambled egg + 2 strips bacon)  Snack: cheese  Second Meal: protein shake + yogurt (or salad) (or hamburger patty + cheese + tomato) Snack: clementine   Third Meal: pinto beans (or pork chops) Snack: none Beverages: water, sometimes milk   Food & Nutrition Related Hx Dietary Hx: States she has also had [employer] chicken nuggets, shredded mini wheat cereal, chicken salad with tomato, collard greens, and a bite of cobbler. Eats a clementine daily. States that her constipation is not as bad if she eats more veggies. Believes she is eating too much at a time because she doesn't always feel hungry in between meals.  Supplements: Celebrate MVI (does not like, not taking daily), calcium (1-2 times/day)  Estimated Daily Fluid Intake: ~64 oz Estimated Daily Protein Intake: ~60 g  Physical Activity  Current average weekly physical activity: walking (tracks daily steps)   Post-Op Goals Using straws: yes Drinking while eating: no Chewing/swallowing  difficulties: no Changes in vision: no Changes to mood/headaches: no Hair loss/changes to skin/nails: no Difficulty focusing/concentrating: no Sweating: no Dizziness/lightheadedness: no Palpitations: no  Carbonated/caffeinated beverages: no N/V/D/C/Gas: nausea, constipation Abdominal pain: no Dumping syndrome: no   NUTRITION DIAGNOSIS  Overweight/obesity ([place]-3.3) related to past poor dietary habits and physical inactivity as evidenced by patient w/ completed Sleeve Gastrectomy surgery following dietary guidelines for continued weight loss.   NUTRITION INTERVENTION Nutrition counseling (C-1) and education (E-2) to facilitate bariatric surgery goals, including: . Diet advancement to the next phase (phase IV) now including non-starchy vegetables  . The importance of consuming adequate calories as well as certain nutrients daily due to the body's need for essential vitamins, minerals, and fats . The importance of daily physical activity and to reach a goal of at least 150 minutes of moderate to vigorous physical activity weekly (or as directed by their physician) due to benefits such as increased musculature and improved lab values  Handouts Provided Include   Phase 4: Protein + Non-Starchy Vegetables   Learning Style & Readiness for Change Teaching method utilized: Visual & Auditory  Demonstrated degree of understanding via: Teach Back  Barriers to learning/adherence to lifestyle change: None Identified    MONITORING & EVALUATION Dietary intake, weekly  physical activity, body weight, and goals in 4 months.  Next Steps Patient is to follow-up in 4 months for 6 month post-op class.

## 2019-02-20 IMAGING — RF DG UGI W/ KUB
12 of 14 series · 12 of 24 positions shown · non-contrast
Comparison: None.

CLINICAL DATA: Preoperative for gastric sleeve surgery. Morbid
obesity.

EXAM:
UPPER GI SERIES WITH KUB
TECHNIQUE: After obtaining a scout radiograph a routine upper GI series was
performed using thin barium
FLUOROSCOPY TIME:  Fluoroscopy Time:  2.4 minutes
Radiation Exposure Index (if provided by the fluoroscopic device):
7.2 mGy
Number of Acquired Spot Images: 4

[Series 2: cp_standard · 0.34mm/px · 1 of 36 frames shown (1 of 11)]
[frame 19/36]
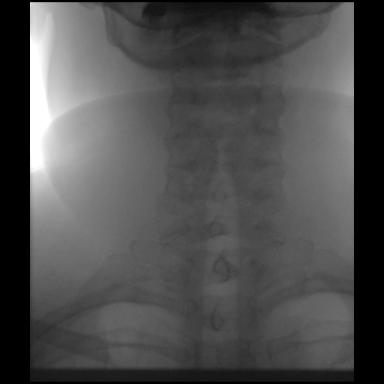

[Series 3: cp_standard · 0.34mm/px · 1 of 50 frames shown (2 of 11)]
[frame 11/50]
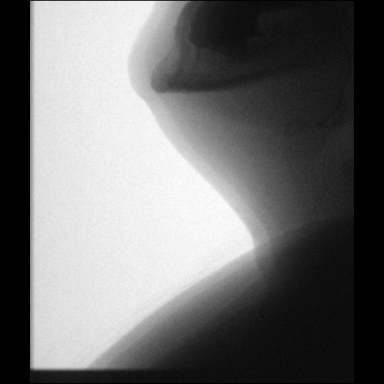

[Series 4: cp_standard · 0.34mm/px · 1 of 59 frames shown (3 of 11)]
[frame 9/59]
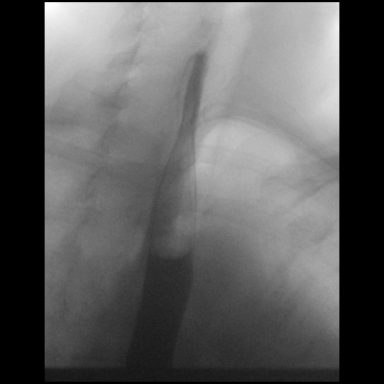

[Series 5: cp_standard · 0.35mm/px · 1 of 65 frames shown (4 of 11)]
[frame 56/65]
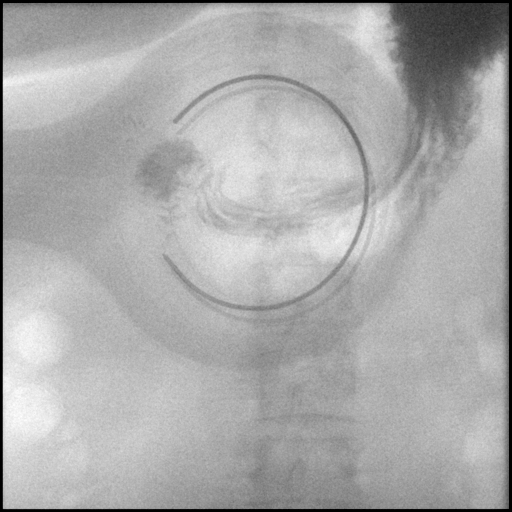

[Series 8: fluoro_barium 2fps_bw · 0.17mm/px · 1 of 1 slices shown]
[im 1/1]
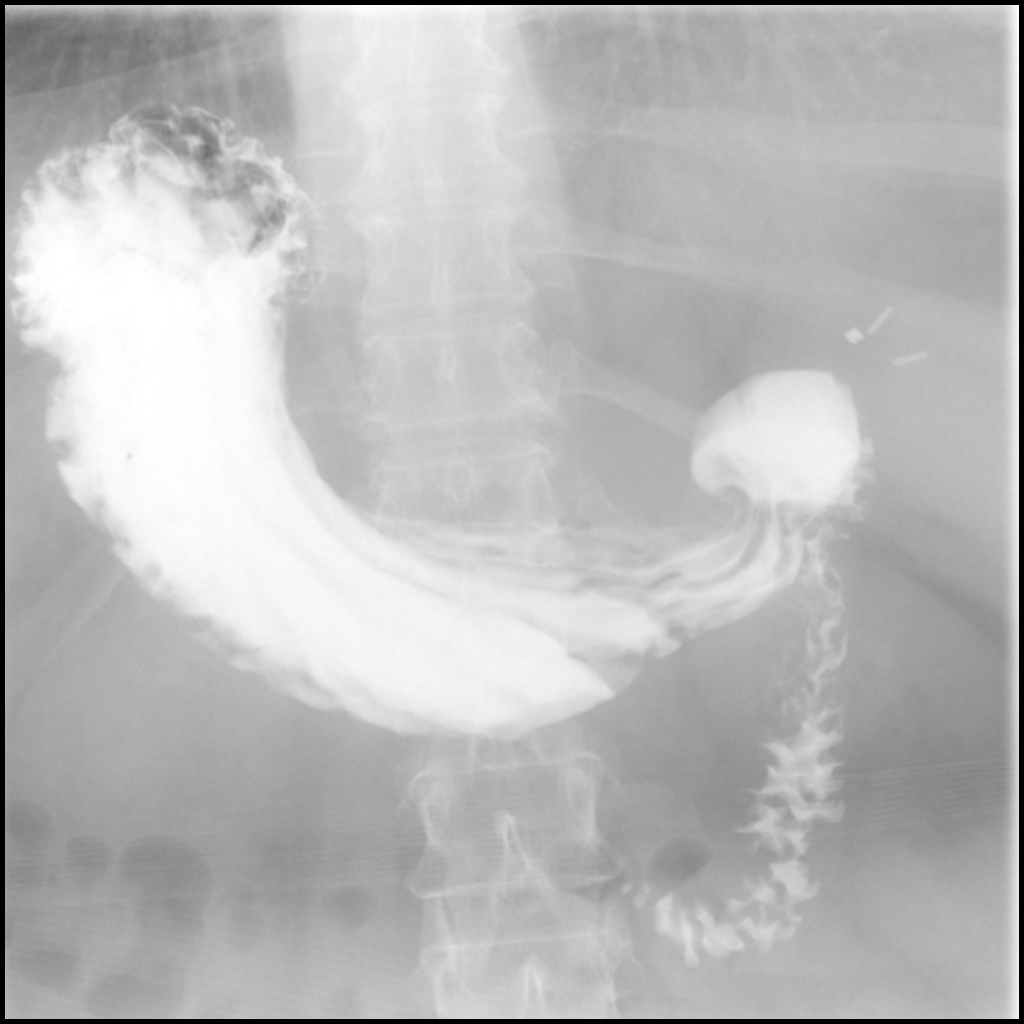

[Series 10: cp_standard · 0.35mm/px · 1 of 46 frames shown (5 of 11)]
[frame 40/46]
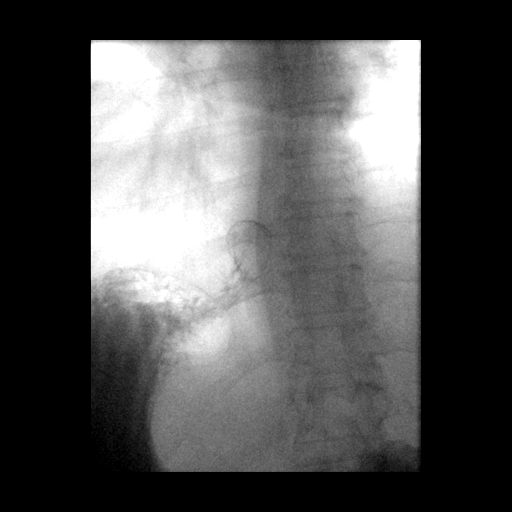

[Series 11: cp_standard · 0.35mm/px · 1 of 33 frames shown (6 of 11)]
[frame 29/33]
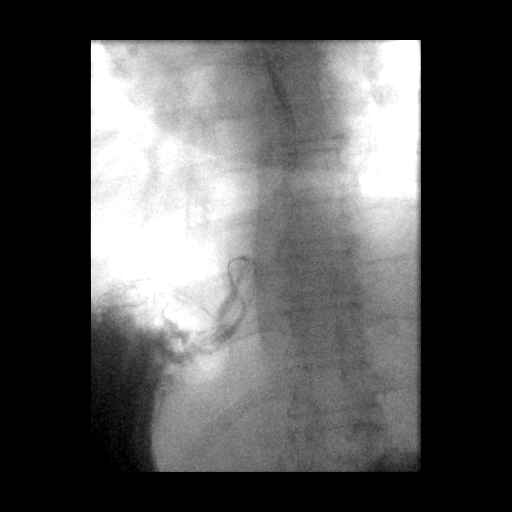

[Series 12: cp_standard · 0.35mm/px · 1 of 39 frames shown (7 of 11)]
[frame 34/39]
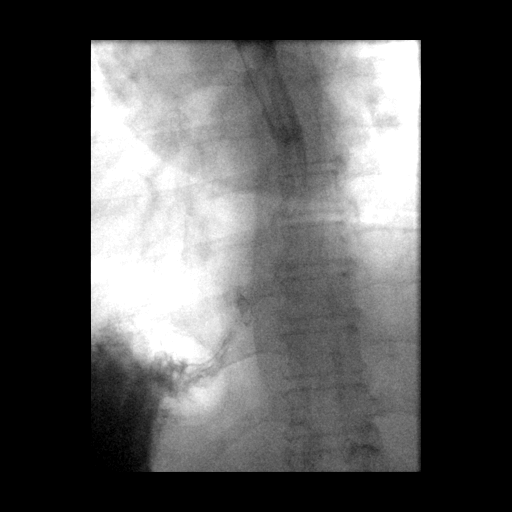

[Series 13: cp_standard · 0.35mm/px · 1 of 37 frames shown (8 of 11)]
[frame 32/37]
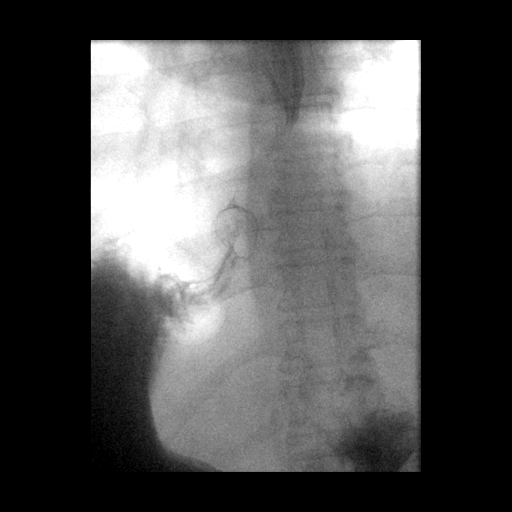

[Series 14: cp_standard · 0.35mm/px · 1 of 8 frames shown (9 of 11)]
[frame 7/8]
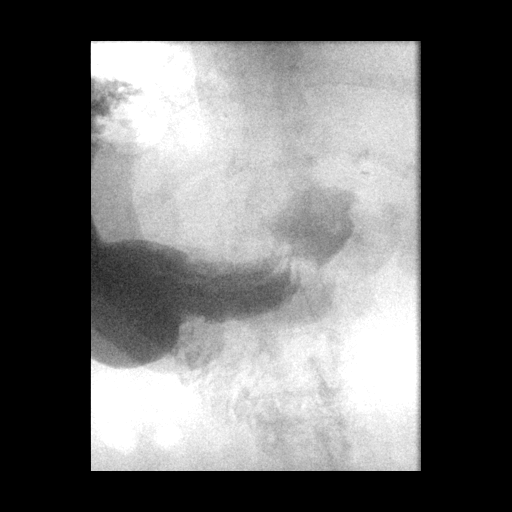

[Series 15: cp_standard · 0.36mm/px · 1 of 37 frames shown (10 of 11)]
[frame 32/37]
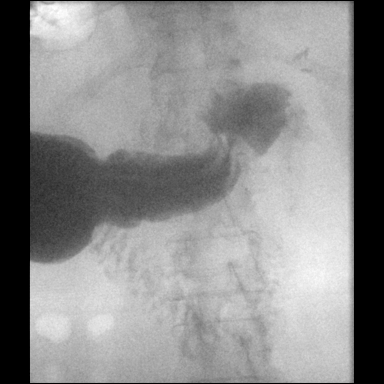

[Series 16: cp_standard · 0.35mm/px · 1 of 14 frames shown (11 of 11)]
[frame 14/14]
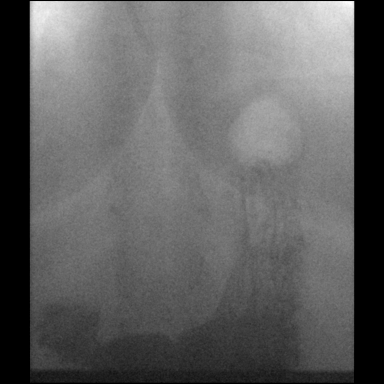

[12 of 24 positions shown; findings below may reference images not displayed]

FINDINGS: Initial KUB demonstrates cholecystectomy clips but appears otherwise
unremarkable.

There appears to be mild extrinsic narrowing of the esophagus in the
vicinity of the thoracic inlet which is smoothly marginated. This
does not obstruct a 13 mm barium tablet.

Mucosal relief compression assessment of the stomach appears normal,
with no gastric lesion identified.

Primary peristaltic waves in the esophagus were normal.

Duodenal morphology normal.
IMPRESSION: 1. Symmetric and smooth but abnormal narrowing of the esophagus in
the vicinity of the thoracic inlet over a 3.7 cm long segment as
shown on image [DATE]. This did not block a 13 mm barium tablet. A
prominent thyroid gland could possibly cause this appearance, and
thyroid ultrasound might be warranted to further investigate. There
is no irregularity of the esophageal wall or endoluminal component.
If the thyroid gland appears normal at sonography then this may
warrant further workup such as endoscopy. Otherwise normal exam.

## 2019-02-21 ENCOUNTER — Ambulatory Visit: Payer: BC Managed Care – PPO

## 2019-03-03 ENCOUNTER — Ambulatory Visit: Payer: BC Managed Care – PPO | Admitting: Dietician

## 2020-04-03 ENCOUNTER — Encounter (HOSPITAL_COMMUNITY): Payer: Self-pay | Admitting: *Deleted

## 2021-04-10 ENCOUNTER — Encounter (HOSPITAL_COMMUNITY): Payer: Self-pay | Admitting: *Deleted

## 2022-04-03 ENCOUNTER — Encounter (HOSPITAL_COMMUNITY): Payer: Self-pay | Admitting: *Deleted

## 2022-06-10 DIAGNOSIS — Z131 Encounter for screening for diabetes mellitus: Secondary | ICD-10-CM

## 2022-06-10 LAB — GLUCOSE, POCT (MANUAL RESULT ENTRY): POC Glucose: 162 mg/dl — AB (ref 70–99)

## 2022-06-10 NOTE — Congregational Nurse Program (Signed)
  Dept: [phone]   Congregational Nurse Program Note  Date of Encounter: 06/10/2022 Per client request only glucose screening this visit. There were no vitals taken for this visit. Non-fasting glucose= 162 at 11:55am, client reported she ate a cookie at 8:00 am.  Client at [facility], [place] stated she [facility] Dr. Adriana Simas in [place], [place] on Jun 17, 2022. She reported she has been watching her glucose levels.  Instructed client she needs to report the 162 glucose at her next provider appointment in May 2024. She stated she will report today's glucose result to her provider.  Julianne Handler RN [employer] [phone]    Past Medical History: Past Medical History:  Diagnosis Date   Apnea    Cirrhosis of liver not due to alcohol (HCC)    Complication of anesthesia    Hyperlipidemia    Hypertension    Myocardial infarction (HCC)    PONV (postoperative nausea and vomiting)     Encounter Details:  CNP Questionnaire - 06/10/22 1151       Questionnaire   Ask client: Do you give verbal consent for me to treat you today? 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[place] [employer]    Insurance/Financial Assistance Referral N/A    Medication N/A    Medical Provider Yes    Screening Referrals Made N/A    Medical Referrals Made N/A    Medical Appointment Made N/A    Recently w/o PCP, now 1st time PCP visit completed due to CNs referral or appointment made N/A    Food N/A    Transportation N/A    Housing/Utilities N/A    Interpersonal Safety N/A    Interventions Advocate/Support;Educate;Counsel    Abnormal to Normal Screening Since Last CN Visit N/A    Screenings CN Performed Blood Glucose   non-fasting 162, client stated she has a medical appointment Jun 17, 2022   Sent Client to Lab for: N/A     Did client attend any of the following based off CNs referral or appointments made? N/A    ED Visit Averted N/A    Life-Saving Intervention Made N/A

## 2023-04-08 ENCOUNTER — Encounter (HOSPITAL_COMMUNITY): Payer: Self-pay | Admitting: *Deleted
